# Patient Record
Sex: Male | Born: 1990 | Race: White | Hispanic: No | Marital: Single | Smoking: Former smoker
Health system: Southern US, Community
[De-identification: ages and names within clinical notes are randomized; demographics above are authoritative.]

## PROBLEM LIST (undated history)

## (undated) DIAGNOSIS — J15212 Pneumonia due to Methicillin resistant Staphylococcus aureus: Secondary | ICD-10-CM

## (undated) DIAGNOSIS — N200 Calculus of kidney: Secondary | ICD-10-CM

## (undated) HISTORY — PX: LUNG SURGERY: SHX703

---
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
## 2013-07-01 ENCOUNTER — Emergency Department (HOSPITAL_COMMUNITY)
Admission: EM | Admit: 2013-07-01 | Discharge: 2013-07-01 | Disposition: A | Discharge status: Home / Self Care | Payer: Self-pay | Attending: Emergency Medicine | Admitting: Emergency Medicine

## 2013-07-01 ENCOUNTER — Emergency Department (HOSPITAL_COMMUNITY): Payer: Self-pay

## 2013-07-01 ENCOUNTER — Encounter (HOSPITAL_COMMUNITY): Payer: Self-pay | Admitting: Emergency Medicine

## 2013-07-01 DIAGNOSIS — IMO0002 Reserved for concepts with insufficient information to code with codable children: Secondary | ICD-10-CM | POA: Insufficient documentation

## 2013-07-01 DIAGNOSIS — Z87891 Personal history of nicotine dependence: Secondary | ICD-10-CM | POA: Insufficient documentation

## 2013-07-01 DIAGNOSIS — J029 Acute pharyngitis, unspecified: Secondary | ICD-10-CM | POA: Insufficient documentation

## 2013-07-01 DIAGNOSIS — J45901 Unspecified asthma with (acute) exacerbation: Secondary | ICD-10-CM | POA: Insufficient documentation

## 2013-07-01 DIAGNOSIS — Z79899 Other long term (current) drug therapy: Secondary | ICD-10-CM | POA: Insufficient documentation

## 2013-07-01 DIAGNOSIS — R61 Generalized hyperhidrosis: Secondary | ICD-10-CM | POA: Insufficient documentation

## 2013-07-01 DIAGNOSIS — R112 Nausea with vomiting, unspecified: Secondary | ICD-10-CM | POA: Insufficient documentation

## 2013-07-01 MED ORDER — ALBUTEROL SULFATE (2.5 MG/3ML) 0.083% IN NEBU: 5.0000 mg | INHALATION_SOLUTION | RESPIRATORY_TRACT | Status: AC | PRN

## 2013-07-01 MED ORDER — ALBUTEROL SULFATE (2.5 MG/3ML) 0.083% IN NEBU
5.0000 mg | INHALATION_SOLUTION | Freq: Once | RESPIRATORY_TRACT | Status: AC
  Administered 2013-07-01: 5 mg via RESPIRATORY_TRACT
  Filled 2013-07-01: qty 6

## 2013-07-01 MED ORDER — PREDNISONE 20 MG PO TABS: 40.0000 mg | ORAL_TABLET | Freq: Every day | ORAL | Status: AC

## 2013-07-01 MED ORDER — ALBUTEROL SULFATE HFA 108 (90 BASE) MCG/ACT IN AERS: 2.0000 | INHALATION_SPRAY | RESPIRATORY_TRACT | Status: DC | PRN

## 2013-07-01 NOTE — ED Provider Notes (Signed)
CSN: [id number]     Arrival date & time 07/01/13  1319 History   First MD Initiated Contact with Patient 07/01/13 1332    This chart was scribed for Ricardo PiccoloJennifer Ceylon Arenson PA-C, a non-physician practitioner working with Leonette Mostharles B. Bernette MayersSheldon, MD by Lewanda RifeAlexandra Hurtado, ED Scribe. This patient was seen in room TR09C/TR09C and the patient's care was started at 1:45 PM     Chief Complaint  Patient presents with  . Shortness of Breath  . Cough   (Consider location/radiation/quality/duration/timing/severity/associated sxs/prior Treatment) The history is provided by the patient. No language interpreter was used.   HPI Comments: Ricardo SillJames Mccarty is a 23 y.o. male who presents to the Emergency Department complaining of intermittent worsening shortness of breath onset 3 days. Reports associated dry cough, sweats in the morning, and sore throat. Reports trying albuterol 2-3 times a day with moderate relief of symptoms. Reports symptoms are exacerbated at night and with exertion. Denies associated fever, nausea, and emesis. Denies recent long travel or extended immobility, prior history of DVT or PE, unilateral leg swelling or pain, hemoptysis, and active cancer. Denies hx of recent surgeries. Reports PMHx of asthma, and MRSA pneumonia 6 years ago. PERC negative.   Additionally, reports he has not been wearing a mask at work and is concerned he inhaled hazardous products.     Past Medical History  Diagnosis Date  . Asthma   . MRSA pneumonia    Past Surgical History  Procedure Laterality Date  . Lung surgery     No family history on file. History  Substance Use Topics  . Smoking status: Former Games developermoker  . Smokeless tobacco: Not on file  . Alcohol Use: No    Review of Systems  Constitutional: Positive for diaphoresis. Negative for fever.  HENT: Positive for sore throat.   Respiratory: Positive for cough and shortness of breath.   Cardiovascular: Negative for leg swelling.  Gastrointestinal:  Positive for nausea and vomiting.  Psychiatric/Behavioral: Negative for confusion.    Allergies  Review of patient's allergies indicates no known allergies.  Home Medications   Current Outpatient Rx  Name  Route  Sig  Dispense  Refill  . albuterol (PROVENTIL) (2.5 MG/3ML) 0.083% nebulizer solution   Nebulization   Take 2.5 mg by nebulization every 6 (six) hours as needed for wheezing or shortness of breath.         [name]. albuterol (PROVENTIL HFA;VENTOLIN HFA) 108 (90 BASE) MCG/ACT inhaler   Inhalation   Inhale 2 puffs into the lungs every 4 (four) hours as needed for wheezing or shortness of breath.   1 Inhaler   0   . albuterol (PROVENTIL) (2.5 MG/3ML) 0.083% nebulizer solution   Nebulization   Take 6 mLs (5 mg total) by nebulization every 4 (four) hours as needed for wheezing or shortness of breath.   75 mL   12   . predniSONE (DELTASONE) 20 MG tablet   Oral   Take 2 tablets (40 mg total) by mouth daily.   10 tablet   0    BP 141/96  Pulse 107  Temp(Src) 98.2 F (36.8 C) (Oral)  Resp 16  Ht 5\' 6"  (1.676 m)  Wt 160 lb (72.576 kg)  BMI 25.84 kg/m2  SpO2 98% Physical Exam  Constitutional: He is oriented to person, place, and time. He appears well-developed and well-nourished. No distress.  HENT:  Head: Normocephalic and atraumatic.  Right Ear: External ear normal.  Left Ear: External ear normal.  Nose: Nose normal.  Mouth/Throat: Uvula is midline, oropharynx is clear and moist and mucous membranes are normal. No trismus in the jaw. No oropharyngeal exudate, posterior oropharyngeal edema, posterior oropharyngeal erythema or tonsillar abscesses.  Eyes: Conjunctivae are normal.  Neck: Normal range of motion. Neck supple.  Cardiovascular: Normal rate and regular rhythm.   Pulmonary/Chest: Effort normal and breath sounds normal. No accessory muscle usage. Not tachypneic. No respiratory distress. He has no wheezes. He has no rales.  Mild expiratory wheeze   Abdominal:  Soft.  Musculoskeletal: Normal range of motion.  Neurological: He is alert and oriented to person, place, and time.  Skin: Skin is warm and dry. He is not diaphoretic.  Psychiatric: He has a normal mood and affect.    ED Course  Procedures  Medications  albuterol (PROVENTIL) (2.5 MG/3ML) 0.083% nebulizer solution 5 mg (5 mg Nebulization Given 07/01/13 1449)    COORDINATION OF CARE:  Nursing notes reviewed. Vital signs reviewed. Initial pt interview and examination performed.   1:42 PM-Discussed work up plan with pt at bedside, which includes CXR. Pt agrees with plan.  2:38 PM Nursing Notes Reviewed/ Care Coordinated Applicable Imaging Reviewed and incorporated into ED treatment Discussed results and treatment plan with pt. Pt demonstrates understanding and agrees with plan.  Treatment plan initiated: Medications  albuterol (PROVENTIL) (2.5 MG/3ML) 0.083% nebulizer solution 5 mg (5 mg Nebulization Given 07/01/13 1449)     Initial diagnostic testing ordered.    Labs Review Labs Reviewed - No data to display Imaging Review Dg Chest 2 View  07/01/2013   CLINICAL DATA:  Sore throat, cough  EXAM: CHEST  2 VIEW  COMPARISON:  01/01/2011  FINDINGS: The heart size and mediastinal contours are within normal limits. Both lungs are clear. The visualized skeletal structures are unremarkable.  IMPRESSION: No active cardiopulmonary disease.   Electronically Signed   By: [name] M.D.   On: 07/01/2013 14:33    EKG Interpretation   None       MDM   1. Asthma exacerbation     Afebrile, NAD, non-toxic appearing, AAOx4. Patient maintained O2 saturations maintained >90, no current signs of respiratory distress. Lung exam improved after nebulizer treatment. Prednisone burst will be given at d/c. Pt states they are breathing at baseline. CXR reviewed, no signs of infection. Pt has been instructed to continue using prescribed medications and to speak with PCP about today's exacerbation.  Patient is stable at time of discharge     I personally performed the services described in this documentation, which was scribed in my presence. The recorded information has been reviewed and is accurate.    Jeannetta Ellis, PA-C 07/01/13 1512

## 2013-07-01 NOTE — ED Notes (Signed)
Pt reports last 2-3 days when waking up in the mornings has had shortness of breath. States had MRSA in his lungs as a child. 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

## 2013-07-01 NOTE — ED Notes (Signed)
Kelly RT called for hour long neb treatment

## 2013-07-01 NOTE — Discharge Instructions (Signed)
Please follow up with your primary care physician in 1-2 days. If you do not have one please call the [facility] number listed above. Please take medications as prescribed. Please read all discharge instructions and return precautions.    Asthma, Adult Asthma is a recurring condition in which the airways tighten and narrow. Asthma can make it difficult to breathe. It can cause coughing, wheezing, and shortness of breath. Asthma episodes (also called asthma attacks) range from minor to life-threatening. Asthma cannot be cured, but medicines and lifestyle changes can help control it. CAUSES Asthma is believed to be caused by inherited (genetic) and environmental factors, but its exact cause is unknown. Asthma may be triggered by allergens, lung infections, or irritants in the air. Asthma triggers are different for each person. Common triggers include:   Animal dander.  Dust mites.  Cockroaches.  Pollen from trees or grass.  Mold.  Smoke.  Air pollutants such as dust, household cleaners, hair sprays, aerosol sprays, paint fumes, strong chemicals, or strong odors.  Cold air, weather changes, and winds (which increase molds and pollens in the air).  Strong emotional expressions such as crying or laughing hard.  Stress.  Certain medicines (such as aspirin) or types of drugs (such as beta-blockers).  Sulfites in foods and drinks. Foods and drinks that may contain sulfites include dried fruit, potato chips, and sparkling grape juice.  Infections or inflammatory conditions such as the flu, a cold, or an inflammation of the nasal membranes (rhinitis).  Gastroesophageal reflux disease (GERD).  Exercise or strenuous activity. SYMPTOMS Symptoms may occur immediately after asthma is triggered or many hours later. Symptoms include:  Wheezing.  Excessive nighttime or early morning coughing.  Frequent or severe coughing with a common cold.  Chest  tightness.  Shortness of breath. DIAGNOSIS  The diagnosis of asthma is made by a review of your medical history and a physical exam. Tests may also be performed. These may include:  Lung function studies. These tests show how much air you breath in and out.  Allergy tests.  Imaging tests such as X-rays. TREATMENT  Asthma cannot be cured, but it can usually be controlled. Treatment involves identifying and avoiding your asthma triggers. It also involves medicines. There are 2 classes of medicine used for asthma treatment:   Controller medicines. These prevent asthma symptoms from occurring. They are usually taken every day.  Reliever or rescue medicines. These quickly relieve asthma symptoms. They are used as needed and provide short-term relief. Your health care provider will help you create an asthma action plan. An asthma action plan is a written plan for managing and treating your asthma attacks. It includes a list of your asthma triggers and how they may be avoided. It also includes information on when medicines should be taken and when their dosage should be changed. An action plan may also involve the use of a device called a peak flow meter. A peak flow meter measures how well the lungs are working. It helps you monitor your condition. HOME CARE INSTRUCTIONS   Take medicine as directed by your health care provider. Speak with your health care provider if you have questions about how or when to take the medicines.  Use a peak flow meter as directed by your health care provider. Record and keep track of readings.  Understand and use the action plan to help minimize or stop an asthma attack without needing to seek medical care.  Control your home environment in the  following ways to help prevent asthma attacks:  Do not smoke. Avoid being exposed to secondhand smoke.  Change your heating and air conditioning filter regularly.  Limit your use of fireplaces and wood stoves.  Get  rid of pests (such as roaches and mice) and their droppings.  Throw away plants if you see mold on them.  Clean your floors and dust regularly. Use unscented cleaning products.  Try to have someone else vacuum for you regularly. Stay out of rooms while they are being vacuumed and for a short while afterward. If you vacuum, use a dust mask from a hardware store, a double-layered or microfilter vacuum cleaner bag, or a vacuum cleaner with a HEPA filter.  Replace carpet with wood, tile, or vinyl flooring. Carpet can trap dander and dust.  Use allergy-proof pillows, mattress covers, and box spring covers.  Wash bed sheets and blankets every week in hot water and dry them in a dryer.  Use blankets that are made of polyester or cotton.  Clean bathrooms and kitchens with bleach. If possible, have someone repaint the walls in these rooms with mold-resistant paint. Keep out of the rooms that are being cleaned and painted.  Wash hands frequently. SEEK MEDICAL CARE IF:   You have wheezing, shortness of breath, or a cough even if taking medicine to prevent attacks.  The colored mucus you cough up (sputum) is thicker than usual.  Your sputum changes from clear or white to yellow, green, gray, or bloody.  You have any problems that may be related to the medicines you are taking (such as a rash, itching, swelling, or trouble breathing).  You are using a reliever medicine more than 2 3 times per week.  Your peak flow is still at 50 79% of you personal best after following your action plan for 1 hour. SEEK IMMEDIATE MEDICAL CARE IF:   You seem to be getting worse and are unresponsive to treatment during an asthma attack.  You are short of breath even at rest.  You get short of breath when doing very little physical activity.  You have difficulty eating, drinking, or talking due to asthma symptoms.  You develop chest pain.  You develop a fast heartbeat.  You have a bluish color to your  lips or fingernails.  You are lightheaded, dizzy, or faint.  Your peak flow is less than 50% of your personal best.  You have a fever or persistent symptoms for more than 2 3 days.  You have a fever and symptoms suddenly get worse. MAKE SURE YOU:   Understand these instructions.  Will watch your condition.  Will get help right away if you are not doing well or get worse. Document Released: 05/20/2005 Document Revised: 01/20/2013 Document Reviewed: 12/17/2012 [facility] Patient Information 2014 [place], [place].

## 2013-07-01 NOTE — ED Notes (Signed)
Patient transported to X-ray 

## 2013-07-02 NOTE — ED Provider Notes (Signed)
Medical screening examination/treatment/procedure(s) were performed by non-physician practitioner and as supervising physician I was immediately available for consultation/collaboration.  EKG Interpretation   None         Messiah Rovira B. Sheetal Lyall, MD 07/02/13 0935 

## 2014-08-30 ENCOUNTER — Emergency Department (HOSPITAL_COMMUNITY)
Admission: EM | Admit: 2014-08-30 | Discharge: 2014-08-31 | Disposition: A | Discharge status: Home / Self Care | Payer: Medicaid Other | Attending: Emergency Medicine | Admitting: Emergency Medicine

## 2014-08-30 ENCOUNTER — Encounter (HOSPITAL_COMMUNITY): Payer: Self-pay | Admitting: *Deleted

## 2014-08-30 DIAGNOSIS — J45909 Unspecified asthma, uncomplicated: Secondary | ICD-10-CM | POA: Insufficient documentation

## 2014-08-30 DIAGNOSIS — Y9289 Other specified places as the place of occurrence of the external cause: Secondary | ICD-10-CM | POA: Insufficient documentation

## 2014-08-30 DIAGNOSIS — Y9389 Activity, other specified: Secondary | ICD-10-CM | POA: Insufficient documentation

## 2014-08-30 DIAGNOSIS — W260XXA Contact with knife, initial encounter: Secondary | ICD-10-CM | POA: Insufficient documentation

## 2014-08-30 DIAGNOSIS — Y998 Other external cause status: Secondary | ICD-10-CM | POA: Insufficient documentation

## 2014-08-30 DIAGNOSIS — Z79899 Other long term (current) drug therapy: Secondary | ICD-10-CM | POA: Insufficient documentation

## 2014-08-30 DIAGNOSIS — Z87891 Personal history of nicotine dependence: Secondary | ICD-10-CM | POA: Insufficient documentation

## 2014-08-30 DIAGNOSIS — Z8701 Personal history of pneumonia (recurrent): Secondary | ICD-10-CM | POA: Insufficient documentation

## 2014-08-30 DIAGNOSIS — Z8614 Personal history of Methicillin resistant Staphylococcus aureus infection: Secondary | ICD-10-CM | POA: Insufficient documentation

## 2014-08-30 DIAGNOSIS — Z7952 Long term (current) use of systemic steroids: Secondary | ICD-10-CM | POA: Insufficient documentation

## 2014-08-30 DIAGNOSIS — Z23 Encounter for immunization: Secondary | ICD-10-CM | POA: Insufficient documentation

## 2014-08-30 DIAGNOSIS — IMO0002 Reserved for concepts with insufficient information to code with codable children: Secondary | ICD-10-CM

## 2014-08-30 DIAGNOSIS — S61211A Laceration without foreign body of left index finger without damage to nail, initial encounter: Secondary | ICD-10-CM | POA: Insufficient documentation

## 2014-08-30 MED ORDER — TETANUS-DIPHTH-ACELL PERTUSSIS 5-2.5-18.5 LF-MCG/0.5 IM SUSP
0.5000 mL | Freq: Once | INTRAMUSCULAR | Status: AC
  Administered 2014-08-31: 0.5 mL via INTRAMUSCULAR
  Filled 2014-08-30: qty 0.5

## 2014-08-30 MED ORDER — LIDOCAINE HCL (PF) 1 % IJ SOLN
5.0000 mL | Freq: Once | INTRAMUSCULAR | Status: AC
Start: 2014-08-30 — End: 2014-08-31
  Administered 2014-08-31: 5 mL via INTRADERMAL
  Filled 2014-08-30: qty 5

## 2014-08-30 NOTE — ED Notes (Signed)
The pt has a laceration to the lt hand at the base of his lt index finger.  He was cutting pvc pipe and his jknife slipped and cut his hand.  No active bleeding.  Superficial appearing cut

## 2014-08-30 NOTE — ED Provider Notes (Signed)
CSN: [id number]     Arrival date & time 08/30/14  2216 History  This chart was scribed for Harle BattiestElizabeth Gray Maugeri, NP, working with Derwood KaplanAnkit Nanavati, MD by Elon SpannerGarrett Cook, ED Scribe. This patient was seen in room TR08C/TR08C and the patient's care was started at 11:35 PM.   Chief Complaint  Patient presents with  . Laceration   The history is provided by the patient. No language interpreter was used.   HPI Comments: Ricardo Mccarty is a 24 y.o. male who presents to the Emergency Department complaining of a laceration on the dorsal aspect of the left hand sustained earlier 1.5 hours PTA.  The patient reports he was trying to cut PVC pipe with a clean, straight-edge pocket knife when it slipped and caused the laceration. He reports 2-3/10 pain, worse with movement.  Patient is unsure if he is UTD on tetanus.   Past Medical History  Diagnosis Date  . Asthma   . MRSA pneumonia    Past Surgical History  Procedure Laterality Date  . Lung surgery     No family history on file. History  Substance Use Topics  . Smoking status: Former Games developermoker  . Smokeless tobacco: Not on file  . Alcohol Use: No    Review of Systems  Skin: Positive for wound.  Neurological: Negative for weakness.      Allergies  Review of patient's allergies indicates no known allergies.  Home Medications   Prior to Admission medications   Medication Sig Start Date End Date Taking? Authorizing Provider  albuterol (PROVENTIL HFA;VENTOLIN HFA) 108 (90 BASE) MCG/ACT inhaler Inhale 2 puffs into the lungs every 4 (four) hours as needed for wheezing or shortness of breath. 07/01/13   Francee PiccoloJennifer Piepenbrink, PA-C  albuterol (PROVENTIL) (2.5 MG/3ML) 0.083% nebulizer solution Take 2.5 mg by nebulization every 6 (six) hours as needed for wheezing or shortness of breath.    Historical Provider, MD  albuterol (PROVENTIL) (2.5 MG/3ML) 0.083% nebulizer solution Take 6 mLs (5 mg total) by nebulization every 4 (four) hours as needed for  wheezing or shortness of breath. 07/01/13   Francee PiccoloJennifer Piepenbrink, PA-C  predniSONE (DELTASONE) 20 MG tablet Take 2 tablets (40 mg total) by mouth daily. 07/01/13   Jennifer Piepenbrink, PA-C   BP 142/80 mmHg  Pulse 81  Temp(Src) 97.8 F (36.6 C)  Resp 18  SpO2 97% Physical Exam  Constitutional: He is oriented to person, place, and time. He appears well-developed and well-nourished. No distress.  HENT:  Head: Normocephalic and atraumatic.  Eyes: Conjunctivae and EOM are normal.  Neck: Neck supple. No tracheal deviation present.  Cardiovascular: Normal rate.   Pulmonary/Chest: Effort normal. No respiratory distress.  Musculoskeletal: Normal range of motion.  Neurological: He is alert and oriented to person, place, and time.  5/5 with flexion and extension for his index finger.  NVI  Skin: Skin is warm and dry.  2 cm linear laceration over the MCP of the index finger on the dorsal aspect of the left hand   Psychiatric: He has a normal mood and affect. His behavior is normal.  Nursing note and vitals reviewed.   ED Course  Procedures (including critical care time)  LACERATION REPAIR Performed by: Harle Battiestysinger, Zeniah Briney Authorized by: Harle Battiestysinger, Adyan Palau Consent: Verbal consent obtained. Risks and benefits: risks, benefits and alternatives were discussed Consent given by: patient Patient identity confirmed: provided demographic data Prepped and Draped in normal sterile fashion Wound explored  Laceration Location: MCP of the index finger on the dorsal aspect of the  left hand    Laceration Length: 2 cm  No Foreign Bodies seen or palpated  Anesthesia: local infiltration  Local anesthetic: lidocaine 1% without epinephrine  Anesthetic total: 3 ml  Irrigation method: syringe Amount of cleaning: standard  Skin closure: 5-0 prolene  Number of sutures: 3  Technique: simple interrupted  Patient tolerance: Patient tolerated the procedure well with no immediate  complications.   DIAGNOSTIC STUDIES: Oxygen Saturation is 97% on RA, normal by my interpretation.    COORDINATION OF CARE:  11:38 PM Discussed treatment plan with patient at bedside.  Patient acknowledges and agrees with plan.    Labs Review Labs Reviewed - No data to display  Imaging Review No results found.   EKG Interpretation None      MDM   Final diagnoses:  Laceration   24 yo with laceration. Pressure irrigation performed and lac repair occurred less < 2 hours after injury and was well tolerated. Tdap booster given.  Pt has no co morbidities to effect normal wound healing. Discussed suture home care w pt and answered questions. Pt to return for follow-up for wound check and suture removal in 7 days. Pt is well-appearing, in no acute distress and vital signs reviewed and not concerning. He appears safe to be discharged.  Return precautions provided. Pt aware of plan and in agreements.     I personally performed the services described in this documentation, which was scribed in my presence. The recorded information has been reviewed and is accurate.  Filed Vitals:   08/30/14 2222 08/31/14 0119  BP: 142/80 126/83  Pulse: 81 92  Temp: 97.8 F (36.6 C) 98.3 F (36.8 C)  TempSrc:  Oral  Resp: 18 22  SpO2: 97% 99%   Meds given in ED:  Medications  Tdap (BOOSTRIX) injection 0.5 mL (0.5 mLs Intramuscular Given 08/31/14 0008)  lidocaine (PF) (XYLOCAINE) 1 % injection 5 mL (5 mLs Intradermal Given by Other 08/31/14 0007)    Discharge Medication List as of 08/31/2014  1:19 AM        Harle Battiest, NP 09/01/14 1143  Doug Sou, MD 09/01/14 1456

## 2014-08-31 NOTE — ED Notes (Signed)
Pt c/o laceration to knuckle of L lower index finger. Bleeding controlled on assessment. Pt reports cutting a pvc pipe when the knife slipped and cut his hand. ~3cm lac noted

## 2014-08-31 NOTE — Discharge Instructions (Signed)
Please follow the directions provided. Be sure to return in 7 days for wound recheck and suture removal. Please keep your wound clean and dry and change your dressing daily. Don't hesitate to return for any new, worsening or concerning symptoms.    SEEK IMMEDIATE MEDICAL CARE IF:  Your pain is not controlled with prescribed medicine.  You have severe swelling around the wound causing pain and numbness or a change in color in your arm, hand, leg, or foot.  Your wound splits open and starts bleeding.  You have worsening numbness, weakness, or loss of function of any joint around or beyond the wound.  You develop painful lumps near the wound or on the skin anywhere on your body.

## 2014-09-06 ENCOUNTER — Encounter (HOSPITAL_COMMUNITY): Payer: Self-pay | Admitting: Emergency Medicine

## 2014-09-06 ENCOUNTER — Emergency Department (INDEPENDENT_AMBULATORY_CARE_PROVIDER_SITE_OTHER)
Admission: EM | Admit: 2014-09-06 | Discharge: 2014-09-06 | Disposition: A | Discharge status: Home / Self Care | Payer: Self-pay | Source: Home / Self Care | Attending: Family Medicine | Admitting: Family Medicine

## 2014-09-06 DIAGNOSIS — Z4802 Encounter for removal of sutures: Secondary | ICD-10-CM

## 2014-09-06 NOTE — ED Provider Notes (Signed)
CSN: [id number]     Arrival date & time 09/06/14  0912 History   First MD Initiated Contact with Patient 09/06/14 0932     Chief Complaint  Patient presents with  . Suture / Staple Removal   (Consider location/radiation/quality/duration/timing/severity/associated sxs/prior Treatment) HPI Comments: Patient has laceration repair on left hand at dorsal base of left index finger on 08/30/2014. Is here for suture removal. Denies any issues with wound since repair.   Patient is a 24 y.o. male presenting with suture removal. The history is provided by the patient.  Suture / Staple Removal    Past Medical History  Diagnosis Date  . Asthma   . MRSA pneumonia    Past Surgical History  Procedure Laterality Date  . Lung surgery     No family history on file. History  Substance Use Topics  . Smoking status: Former Games developermoker  . Smokeless tobacco: Not on file  . Alcohol Use: No    Review of Systems  All other systems reviewed and are negative.   Allergies  Review of patient's allergies indicates no known allergies.  Home Medications   Prior to Admission medications   Medication Sig Start Date End Date Taking? Authorizing Provider  albuterol (PROVENTIL HFA;VENTOLIN HFA) 108 (90 BASE) MCG/ACT inhaler Inhale 2 puffs into the lungs every 4 (four) hours as needed for wheezing or shortness of breath. 07/01/13   Francee PiccoloJennifer Piepenbrink, PA-C  albuterol (PROVENTIL) (2.5 MG/3ML) 0.083% nebulizer solution Take 2.5 mg by nebulization every 6 (six) hours as needed for wheezing or shortness of breath.    Historical Provider, MD  albuterol (PROVENTIL) (2.5 MG/3ML) 0.083% nebulizer solution Take 6 mLs (5 mg total) by nebulization every 4 (four) hours as needed for wheezing or shortness of breath. 07/01/13   Francee PiccoloJennifer Piepenbrink, PA-C  predniSONE (DELTASONE) 20 MG tablet Take 2 tablets (40 mg total) by mouth daily. 07/01/13   Shriley Joffe Piepenbrink, PA-C   BP 131/91 mmHg  Pulse 83  Temp(Src) 97.9 F (36.6 C)  (Oral)  Resp 16  SpO2 100% Physical Exam  Constitutional: He is oriented to person, place, and time. He appears well-developed and well-nourished. No distress.  HENT:  Head: Normocephalic.  Cardiovascular: Normal rate.   Pulmonary/Chest: Effort normal.  Musculoskeletal: Normal range of motion.  Neurological: He is alert and oriented to person, place, and time.  Skin: Skin is warm and dry.     Linear 1.5 cm healing laceration with 2 visible prolene sutures (ER report from 3/29 reports 3 sutures placed, however, patient reports provider only placed 2 sutures at time of repair) No clinical signs of infection   Psychiatric: He has a normal mood and affect. His behavior is normal.  Nursing note and vitals reviewed.   ED Course  Procedures (including critical care time) Labs Review Labs Reviewed - No data to display  Imaging Review No results found.   MDM   1. Encounter for removal of sutures   sutures x 2 removed without incident steristrips applied Follow up prn Wound care at home  Ria ClockJennifer Lee H Floye Fesler, PA 09/06/14 1016

## 2014-09-06 NOTE — ED Notes (Signed)
Seen in ed 3/29.  Seen for laceration to left index finger.  Patient here today for suture removal

## 2014-09-06 NOTE — Discharge Instructions (Signed)

## 2020-08-16 ENCOUNTER — Ambulatory Visit
Admission: RE | Admit: 2020-08-16 | Discharge: 2020-08-16 | Disposition: A | Discharge status: Home / Self Care | Payer: Self-pay | Source: Ambulatory Visit | Attending: Family Medicine | Admitting: Family Medicine

## 2020-08-16 ENCOUNTER — Other Ambulatory Visit: Payer: Self-pay

## 2020-08-16 VITALS — BP 132/89 | HR 97 | Temp 97.8°F

## 2020-08-16 DIAGNOSIS — J069 Acute upper respiratory infection, unspecified: Secondary | ICD-10-CM

## 2020-08-16 DIAGNOSIS — Z1152 Encounter for screening for COVID-19: Secondary | ICD-10-CM

## 2020-08-16 MED ORDER — PROMETHAZINE-DM 6.25-15 MG/5ML PO SYRP: 5.0000 mL | ORAL_SOLUTION | Freq: Four times a day (QID) | ORAL | 0 refills | Status: DC | PRN

## 2020-08-16 MED ORDER — ALBUTEROL SULFATE HFA 108 (90 BASE) MCG/ACT IN AERS: 2.0000 | INHALATION_SPRAY | Freq: Four times a day (QID) | RESPIRATORY_TRACT | 0 refills | Status: AC | PRN

## 2020-08-16 NOTE — ED Triage Notes (Signed)
Pt presents with c/o cough and sore throat that began yesterday, needs covid test for work

## 2020-08-17 LAB — NOVEL CORONAVIRUS, NAA: SARS-CoV-2, NAA: NOT DETECTED

## 2020-08-17 LAB — SARS-COV-2, NAA 2 DAY TAT

## 2020-08-20 NOTE — ED Provider Notes (Signed)
[facility]    CSN: [id number] Arrival date & time: 08/16/20  1245      History   Chief Complaint Chief Complaint  Patient presents with  . Sore Throat  . Cough    HPI Ricardo Mccarty is a 30 y.o. male.   Presenting today with 1 day history of cough, sore throat, fatigue. Denies fever, chills, CP, SOB, N/V/D, abdominal pain, body aches. So far taking OTC remedies with minimal relief. Daughter and wife sick with similar sxs. Requesting COVID test for work. Hx of asthma, former smoker - on albuterol inhaler and neb soln as needed.      Past Medical History:  Diagnosis Date  . Asthma   . MRSA pneumonia (HCC)     There are no problems to display for this patient.   Past Surgical History:  Procedure Laterality Date  . LUNG SURGERY         Home Medications    Prior to Admission medications   Medication Sig Start Date End Date Taking? Authorizing Provider  promethazine-dextromethorphan (PROMETHAZINE-DM) 6.25-15 MG/5ML syrup Take 5 mLs by mouth 4 (four) times daily as needed for cough. 08/16/20  Yes Particia Nearing, PA-C  albuterol (PROVENTIL) (2.5 MG/3ML) 0.083% nebulizer solution Take 2.5 mg by nebulization every 6 (six) hours as needed for wheezing or shortness of breath.    [provider]  albuterol (PROVENTIL) (2.5 MG/3ML) 0.083% nebulizer solution Take 6 mLs (5 mg total) by nebulization every 4 (four) hours as needed for wheezing or shortness of breath. 07/01/13   Piepenbrink, Victorino Dike, PA-C  albuterol (VENTOLIN HFA) 108 (90 Base) MCG/ACT inhaler Inhale 2 puffs into the lungs every 6 (six) hours as needed for wheezing or shortness of breath. 08/16/20   Particia Nearing, PA-C  predniSONE (DELTASONE) 20 MG tablet Take 2 tablets (40 mg total) by mouth daily. 07/01/13   Piepenbrink, Victorino Dike, PA-C    Family History Family History  Family history unknown: Yes    Social History Social History   Tobacco Use  . Smoking status: Former  Smoker  Substance Use Topics  . Alcohol use: No  . Drug use: No     Allergies   Patient has no known allergies.   Review of Systems Review of Systems PER HPI    Physical Exam Triage Vital Signs ED Triage Vitals  Enc Vitals Group     BP 08/16/20 1259 132/89     Pulse Rate 08/16/20 1259 97     Resp --      Temp 08/16/20 1259 97.8 F (36.6 C)     Temp src --      SpO2 08/16/20 1259 97 %     Weight --      Height --      Head Circumference --      Peak Flow --      Pain Score 08/16/20 1257 6     Pain Loc --      Pain Edu? --      Excl. in GC? --    No data found.  Updated Vital Signs BP 132/89   Pulse 97   Temp 97.8 F (36.6 C)   SpO2 97%   Visual Acuity Right Eye Distance:   Left Eye Distance:   Bilateral Distance:    Right Eye Near:   Left Eye Near:    Bilateral Near:     Physical Exam Vitals and nursing note reviewed.  Constitutional:      General:  He is not in acute distress.    Appearance: Normal appearance. He is not toxic-appearing.  HENT:     Head: Atraumatic.     Right Ear: Tympanic membrane normal.     Left Ear: Tympanic membrane normal.     Nose: Rhinorrhea present.     Mouth/Throat:     Mouth: Mucous membranes are moist.     Pharynx: Posterior oropharyngeal erythema present. No oropharyngeal exudate.  Eyes:     Extraocular Movements: Extraocular movements intact.     Conjunctiva/sclera: Conjunctivae normal.  Cardiovascular:     Rate and Rhythm: Normal rate and regular rhythm.     Heart sounds: Normal heart sounds.  Pulmonary:     Effort: Pulmonary effort is normal. No respiratory distress.     Breath sounds: Normal breath sounds. No wheezing or rales.  Abdominal:     General: Bowel sounds are normal. There is no distension.     Palpations: Abdomen is soft.     Tenderness: There is no abdominal tenderness. There is no guarding.  Musculoskeletal:        General: Normal range of motion.     Cervical back: Normal range of motion  and neck supple.  Skin:    General: Skin is warm and dry.  Neurological:     General: No focal deficit present.     Mental Status: He is alert and oriented to person, place, and time.  Psychiatric:        Mood and Affect: Mood normal.        Thought Content: Thought content normal.        Judgment: Judgment normal.      UC Treatments / Results  Labs (all labs ordered are listed, but only abnormal results are displayed) Labs Reviewed  NOVEL CORONAVIRUS, NAA   Narrative:    Performed at:  [address] [place], [place]  [id number] Lab Director: Jolene Schimke MD, Phone:  [phone]  SARS-COV-2, NAA 2 DAY TAT   Narrative:    Performed at:  [address] [place] [address] [place], [place]  [id number] Lab Director: Jolene Schimke MD, Phone:  [phone]    EKG   Radiology No results found.  Procedures Procedures (including critical care time)  Medications Ordered in UC Medications - No data to display  Initial Impression / Assessment and Plan / UC Course  I have reviewed the triage vital signs and the nursing notes.  Pertinent labs & imaging results that were available during my care of the patient were reviewed by me and considered in my medical decision making (see chart for details).     Exam, vitals reassuring today. COVID pcr pending, discussed albuterol, phenergan DM, and OTC supportive medications and home care. Return for acutely worsening sxs. Work note given.   Final Clinical Impressions(s) / UC Diagnoses   Final diagnoses:  Viral URI with cough   Discharge Instructions   None    ED Prescriptions    Medication Sig Dispense Auth. Provider   albuterol (VENTOLIN HFA) 108 (90 Base) MCG/ACT inhaler Inhale 2 puffs into the lungs every 6 (six) hours as needed for wheezing or shortness of breath. 1 each Particia Nearing, PA-C   promethazine-dextromethorphan (PROMETHAZINE-DM) 6.25-15 MG/5ML syrup Take 5 mLs by mouth 4  (four) times daily as needed for cough. 118 mL Particia Nearing, [place]     PDMP not reviewed this encounter.   Roosvelt Maser [place], [place] 08/20/20 [phone]

## 2020-10-07 ENCOUNTER — Ambulatory Visit
Admission: EM | Admit: 2020-10-07 | Discharge: 2020-10-07 | Disposition: A | Discharge status: Home / Self Care | Payer: Self-pay | Attending: Family Medicine | Admitting: Family Medicine

## 2020-10-07 ENCOUNTER — Encounter: Payer: Self-pay | Admitting: *Deleted

## 2020-10-07 ENCOUNTER — Other Ambulatory Visit: Payer: Self-pay

## 2020-10-07 DIAGNOSIS — J069 Acute upper respiratory infection, unspecified: Secondary | ICD-10-CM

## 2020-10-07 DIAGNOSIS — Z20822 Contact with and (suspected) exposure to covid-19: Secondary | ICD-10-CM

## 2020-10-07 NOTE — Discharge Instructions (Signed)
You have been tested for COVID-19 today. °If your test returns positive, you will receive a phone call from [facility] regarding your results. °Negative test results are not called. °Both positive and negative results area always visible on MyChart. °If you do not have a MyChart account, sign up instructions are provided in your discharge papers. °Please do not hesitate to contact us should you have questions or concerns. ° °

## 2020-10-07 NOTE — ED Triage Notes (Signed)
C/O starting with sneezing onset yesterday which progressed into chills, tactile fever, cough, sore throat.

## 2020-10-08 ENCOUNTER — Ambulatory Visit: Payer: Self-pay

## 2020-10-09 LAB — NOVEL CORONAVIRUS, NAA: SARS-CoV-2, NAA: NOT DETECTED

## 2020-10-09 LAB — SARS-COV-2, NAA 2 DAY TAT

## 2020-10-09 NOTE — ED Provider Notes (Signed)
  [facility]   [id number] 10/07/20 Arrival Time: 1253  ASSESSMENT & PLAN:  1. Encounter for laboratory testing for COVID-19 virus   2. Viral URI with cough    COVID-19 testing sent. See letter/work note on file for self-isolation guidelines. OTC symptom care as needed.    Follow-up Information    [facility] .   Specialty: Urgent Care Why: If worsening or failing to improve as anticipated. Contact information: [address] [id number] mc [place] [address] [phone]              Reviewed expectations re: course of current medical issues. Questions answered. Outlined signs and symptoms indicating need for more acute intervention. Understanding verbalized. After Visit Summary given.   SUBJECTIVE: History from: patient. Ricardo Mccarty is a 30 y.o. male who presents with worries regarding COVID-19. Known COVID-19 contact: ques co-worker. Recent travel: none. Reports: chills, subj fever, cough, mild ST. Denies: difficulty breathing. Normal PO intake without n/v/d.    OBJECTIVE:  Vitals:   10/07/20 1306  BP: (!) 132/91  Pulse: 93  Resp: 16  Temp: 98 F (36.7 C)  TempSrc: Oral  SpO2: 97%    General appearance: alert; no distress Eyes: PERRLA; EOMI; conjunctiva normal HENT: [place]; AT; with mild nasal congestion Neck: supple  Lungs: speaks full sentences without difficulty; unlabored Extremities: no edema Skin: warm and dry Neurologic: normal gait Psychological: alert and cooperative; normal mood and affect  Labs: Results for orders placed or performed during the hospital encounter of 10/07/20  Novel Coronavirus, NAA ([facility])   Specimen: Nasopharyngeal Swab; Nasopharyngeal(NP) swabs in vial transport medium   Nasopharynge  Result Value Ref Range   SARS-CoV-2, NAA Not Detected Not Detected  SARS-COV-2, NAA 2 DAY TAT   Nasopharynge  Result Value Ref Range   SARS-CoV-2, NAA 2 DAY TAT  Performed       No Known Allergies  Past Medical History:  Diagnosis Date  . Asthma   . MRSA pneumonia [facility])    Social History   Socioeconomic History  . Marital status: Single    Spouse name: Not on file  . Number of children: Not on file  . Years of education: Not on file  . Highest education level: Not on file  Occupational History  . Not on file  Tobacco Use  . Smoking status: Former Games developer  . Smokeless tobacco: Never Used  Vaping Use  . Vaping Use: Never used  Substance and Sexual Activity  . Alcohol use: No  . Drug use: No  . Sexual activity: Not on file  Other Topics Concern  . Not on file  Social History Narrative  . Not on file   Social Determinants of Health   Financial Resource Strain: Not on file  Food Insecurity: Not on file  Transportation Needs: Not on file  Physical Activity: Not on file  Stress: Not on file  Social Connections: Not on file  Intimate Partner Violence: Not on file   Family History  Problem Relation Age of Onset  . Hypertension Mother   . Hypertension Father    Past Surgical History:  Procedure Laterality Date  . LUNG SURGERY       Mardella Layman, MD 10/09/20 [phone]

## 2021-11-07 ENCOUNTER — Ambulatory Visit
Admission: RE | Admit: 2021-11-07 | Discharge: 2021-11-07 | Disposition: A | Discharge status: Home / Self Care | Payer: Self-pay | Source: Ambulatory Visit | Attending: Internal Medicine | Admitting: Internal Medicine

## 2021-11-07 VITALS — BP 137/98 | HR 102 | Temp 98.2°F | Resp 18

## 2021-11-07 DIAGNOSIS — A084 Viral intestinal infection, unspecified: Secondary | ICD-10-CM

## 2021-11-07 DIAGNOSIS — R197 Diarrhea, unspecified: Secondary | ICD-10-CM

## 2021-11-07 DIAGNOSIS — R112 Nausea with vomiting, unspecified: Secondary | ICD-10-CM

## 2021-11-07 MED ORDER — ONDANSETRON 4 MG PO TBDP: 4.0000 mg | ORAL_TABLET | Freq: Three times a day (TID) | ORAL | 0 refills | Status: DC | PRN

## 2021-11-07 NOTE — ED Triage Notes (Signed)
Pt presents with generalized abdominal pain, vomiting, and diarrhea since yesterday.  

## 2021-11-07 NOTE — ED Provider Notes (Signed)
[name]    CSN: [id number] Arrival date & time: 11/07/21  1637      History   Chief Complaint Chief Complaint  Patient presents with   Emesis   Abdominal Pain   Diarrhea    HPI Ricardo Mccarty is a 31 y.o. male.   Patient presents with nausea, vomiting, diarrhea that started yesterday.  He reports that symptoms have improved and he has not had any vomiting today.  Denies blood in stool or emesis.  Denies any known sick contacts.  Denies any upper respiratory symptoms, cough, fever, abdominal pain.  Patient has been to keep fluids down today but no foods.   Emesis Abdominal Pain Diarrhea  Past Medical History:  Diagnosis Date   Asthma    MRSA pneumonia (HCC)     There are no problems to display for this patient.   Past Surgical History:  Procedure Laterality Date   LUNG SURGERY         Home Medications    Prior to Admission medications   Medication Sig Start Date End Date Taking? Authorizing Provider  ondansetron (ZOFRAN-ODT) 4 MG disintegrating tablet Take 1 tablet (4 mg total) by mouth every 8 (eight) hours as needed for nausea or vomiting. 11/07/21  Yes Lavone Weisel, Rolly Salter E, FNP  albuterol (PROVENTIL) (2.5 MG/3ML) 0.083% nebulizer solution Take 2.5 mg by nebulization every 6 (six) hours as needed for wheezing or shortness of breath.    [provider]  albuterol (PROVENTIL) (2.5 MG/3ML) 0.083% nebulizer solution Take 6 mLs (5 mg total) by nebulization every 4 (four) hours as needed for wheezing or shortness of breath. 07/01/13   Piepenbrink, Victorino Dike, PA-C  albuterol (VENTOLIN HFA) 108 (90 Base) MCG/ACT inhaler Inhale 2 puffs into the lungs every 6 (six) hours as needed for wheezing or shortness of breath. 08/16/20   Particia Nearing, PA-C  predniSONE (DELTASONE) 20 MG tablet Take 2 tablets (40 mg total) by mouth daily. 07/01/13   Piepenbrink, Victorino Dike, PA-C  promethazine-dextromethorphan (PROMETHAZINE-DM) 6.25-15 MG/5ML syrup Take 5 mLs by  mouth 4 (four) times daily as needed for cough. 08/16/20   Particia Nearing, PA-C    Family History Family History  Problem Relation Age of Onset   Hypertension Mother    Hypertension Father     Social History Social History   Tobacco Use   Smoking status: Former   Smokeless tobacco: Never  Building services engineer Use: Never used  Substance Use Topics   Alcohol use: No   Drug use: No     Allergies   Patient has no known allergies.   Review of Systems Review of Systems Per HPI  Physical Exam Triage Vital Signs ED Triage Vitals  Enc Vitals Group     BP 11/07/21 1719 (!) 137/98     Pulse Rate 11/07/21 1719 (!) 102     Resp 11/07/21 1719 18     Temp 11/07/21 1719 98.2 F (36.8 C)     Temp Source 11/07/21 1719 Oral     SpO2 11/07/21 1719 95 %     Weight --      Height --      Head Circumference --      Peak Flow --      Pain Score 11/07/21 1718 4     Pain Loc --      Pain Edu? --      Excl. in GC? --    No data found.  Updated Vital Signs BP [name])  137/98 (BP Location: Right Arm)   Pulse (!) 102   Temp 98.2 F (36.8 C) (Oral)   Resp 18   SpO2 95%   Visual Acuity Right Eye Distance:   Left Eye Distance:   Bilateral Distance:    Right Eye Near:   Left Eye Near:    Bilateral Near:     Physical Exam Constitutional:      General: He is not in acute distress.    Appearance: Normal appearance. He is not toxic-appearing or diaphoretic.  HENT:     Head: Normocephalic and atraumatic.     Mouth/Throat:     Mouth: Mucous membranes are moist.     Pharynx: No posterior oropharyngeal erythema.  Eyes:     Extraocular Movements: Extraocular movements intact.     Conjunctiva/sclera: Conjunctivae normal.  Cardiovascular:     Rate and Rhythm: Normal rate and regular rhythm.     Pulses: Normal pulses.     Heart sounds: Normal heart sounds.  Pulmonary:     Effort: Pulmonary effort is normal. No respiratory distress.     Breath sounds: Normal breath sounds.   Abdominal:     General: Bowel sounds are normal. There is no distension.     Palpations: Abdomen is soft.     Tenderness: There is no abdominal tenderness.  Neurological:     General: No focal deficit present.     Mental Status: He is alert and oriented to person, place, and time. Mental status is at baseline.  Psychiatric:        Mood and Affect: Mood normal.        Behavior: Behavior normal.        Thought Content: Thought content normal.        Judgment: Judgment normal.     UC Treatments / Results  Labs (all labs ordered are listed, but only abnormal results are displayed) Labs Reviewed - No data to display  EKG   Radiology No results found.  Procedures Procedures (including critical care time)  Medications Ordered in UC Medications - No data to display  Initial Impression / Assessment and Plan / UC Course  I have reviewed the triage vital signs and the nursing notes.  Pertinent labs & imaging results that were available during my care of the patient were reviewed by me and considered in my medical decision making (see chart for details).     Suspect viral cause to patient's symptoms.  Do not think that imaging or any further evaluation in the hospital is necessary at this time.  Will prescribe ondansetron to take as needed for nausea.  No signs of dehydration on exam.  Patient advised to encourage adequate fluid hydration.  Discussed return and ER precautions.  Patient verbalized understanding and was agreeable with plan. Final Clinical Impressions(s) / UC Diagnoses   Final diagnoses:  Viral gastroenteritis  Nausea vomiting and diarrhea     Discharge Instructions      It appears that you may have a viral stomach bug.  This should run its course and self resolve.  A nausea medication has been prescribed to take as needed.  Please ensure adequate fluid hydration.  Go to the hospital if symptoms persist or worsen.    ED Prescriptions     Medication Sig  Dispense Auth. Provider   ondansetron (ZOFRAN-ODT) 4 MG disintegrating tablet Take 1 tablet (4 mg total) by mouth every 8 (eight) hours as needed for nausea or vomiting. 20 tablet [place], [place] E,  FNP      PDMP not reviewed this encounter.   Gustavus BryantMound, Kayslee Furey E, [place] 11/07/21 1733

## 2021-11-07 NOTE — Discharge Instructions (Signed)
It appears that you may have a viral stomach bug.  This should run its course and self resolve.  A nausea medication has been prescribed to take as needed.  Please ensure adequate fluid hydration.  Go to the hospital if symptoms persist or worsen.

## 2022-03-07 ENCOUNTER — Ambulatory Visit
Admission: RE | Admit: 2022-03-07 | Discharge: 2022-03-07 | Disposition: A | Discharge status: Home / Self Care | Payer: Self-pay | Source: Ambulatory Visit | Attending: Physician Assistant | Admitting: Physician Assistant

## 2022-03-07 ENCOUNTER — Other Ambulatory Visit: Payer: Self-pay

## 2022-03-07 VITALS — BP 158/104 | HR 104 | Temp 98.2°F | Resp 18

## 2022-03-07 DIAGNOSIS — K529 Noninfective gastroenteritis and colitis, unspecified: Secondary | ICD-10-CM

## 2022-03-07 MED ORDER — ONDANSETRON 4 MG PO TBDP: 4.0000 mg | ORAL_TABLET | Freq: Three times a day (TID) | ORAL | 0 refills | Status: AC | PRN

## 2022-03-07 NOTE — ED Provider Notes (Signed)
[facility]    CSN: [id number] Arrival date & time: 03/07/22  1757      History   Chief Complaint Chief Complaint  Patient presents with   Letter for School/Work    Entered by patient    HPI Amy Belloso is a 31 y.o. male.   Patient here today for evaluation of nausea and diarrhea that started yesterday.  He states that he did have some headache but this is resolved.  He states throughout the day today nausea has improved.  He has not had any vomiting but has only been drinking fluids and has not really had appetite.  He did have abdominal pain but this is also resolved.  Family members have had similar symptoms.  He denies any fever.  Ultimately he needs a note to return to work.  The history is provided by the patient.    Past Medical History:  Diagnosis Date   Asthma    MRSA pneumonia ([place])     There are no problems to display for this patient.   Past Surgical History:  Procedure Laterality Date   LUNG SURGERY         Home Medications    Prior to Admission medications   Medication Sig Start Date End Date Taking? Authorizing Provider  ondansetron (ZOFRAN-ODT) 4 MG disintegrating tablet Take 1 tablet (4 mg total) by mouth every 8 (eight) hours as needed. 03/07/22  Yes Francene Finders, PA-C  albuterol (PROVENTIL) (2.5 MG/3ML) 0.083% nebulizer solution Take 2.5 mg by nebulization every 6 (six) hours as needed for wheezing or shortness of breath.    [provider]  albuterol (PROVENTIL) (2.5 MG/3ML) 0.083% nebulizer solution Take 6 mLs (5 mg total) by nebulization every 4 (four) hours as needed for wheezing or shortness of breath. 07/01/13   Piepenbrink, Anderson Malta, PA-C  albuterol (VENTOLIN HFA) 108 (90 Base) MCG/ACT inhaler Inhale 2 puffs into the lungs every 6 (six) hours as needed for wheezing or shortness of breath. 08/16/20   Volney American, PA-C  predniSONE (DELTASONE) 20 MG tablet Take 2 tablets (40 mg total) by mouth  daily. Patient not taking: Reported on 03/07/2022 07/01/13   Piepenbrink, Anderson Malta, PA-C  promethazine-dextromethorphan (PROMETHAZINE-DM) 6.25-15 MG/5ML syrup Take 5 mLs by mouth 4 (four) times daily as needed for cough. 08/16/20   Volney American, PA-C    Family History Family History  Problem Relation Age of Onset   Hypertension Mother    Hypertension Father     Social History Social History   Tobacco Use   Smoking status: Former   Smokeless tobacco: Never  Scientific laboratory technician Use: Never used  Substance Use Topics   Alcohol use: No   Drug use: No     Allergies   Patient has no known allergies.   Review of Systems Review of Systems  Constitutional:  Negative for chills and fever.  Eyes:  Negative for discharge and redness.  Respiratory:  Negative for shortness of breath.   Gastrointestinal:  Positive for abdominal pain, diarrhea and nausea. Negative for vomiting.  Neurological:  Positive for headaches (resolved). Negative for numbness.     Physical Exam Triage Vital Signs ED Triage Vitals [03/07/22 1838]  Enc Vitals Group     BP (!) 158/104     Pulse Rate (!) 104     Resp 18     Temp 98.2 F (36.8 C)     Temp Source Oral     SpO2  Weight      Height      Head Circumference      Peak Flow      Pain Score 0     Pain Loc      Pain Edu?      Excl. in GC?    No data found.  Updated Vital Signs BP (!) 158/104 (BP Location: Left Arm)   Pulse (!) 104   Temp 98.2 F (36.8 C) (Oral)   Resp 18      Physical Exam Vitals and nursing note reviewed.  Constitutional:      General: He is not in acute distress.    Appearance: Normal appearance. He is not ill-appearing.  HENT:     Head: Normocephalic and atraumatic.  Eyes:     Conjunctiva/sclera: Conjunctivae normal.  Cardiovascular:     Rate and Rhythm: Normal rate.  Pulmonary:     Effort: Pulmonary effort is normal.  Neurological:     Mental Status: He is alert.  Psychiatric:        Mood  and Affect: Mood normal.        Behavior: Behavior normal.        Thought Content: Thought content normal.      UC Treatments / Results  Labs (all labs ordered are listed, but only abnormal results are displayed) Labs Reviewed - No data to display  EKG   Radiology No results found.  Procedures Procedures (including critical care time)  Medications Ordered in UC Medications - No data to display  Initial Impression / Assessment and Plan / UC Course  I have reviewed the triage vital signs and the nursing notes.  Pertinent labs & imaging results that were available during my care of the patient were reviewed by me and considered in my medical decision making (see chart for details).   Zofran prescribed for nausea.  Work note provided as requested.  Recommended follow-up if symptoms do not continue to improve or with any further concerns.  Patient expresses understanding.  Final Clinical Impressions(s) / UC Diagnoses   Final diagnoses:  Gastroenteritis   Discharge Instructions   None    ED Prescriptions     Medication Sig Dispense Auth. Provider   ondansetron (ZOFRAN-ODT) 4 MG disintegrating tablet Take 1 tablet (4 mg total) by mouth every 8 (eight) hours as needed. 20 tablet Tomi Bamberger, PA-C      PDMP not reviewed this encounter.   Tomi Bamberger, PA-C 03/07/22 1850

## 2022-03-07 NOTE — ED Triage Notes (Signed)
Pt needs work note to return to work after having GI sx yesterday; pt sts resolved throughout today

## 2022-05-18 ENCOUNTER — Ambulatory Visit
Admission: RE | Admit: 2022-05-18 | Discharge: 2022-05-18 | Disposition: A | Discharge status: Home / Self Care | Payer: Self-pay | Source: Ambulatory Visit | Attending: Internal Medicine | Admitting: Internal Medicine

## 2022-05-18 VITALS — BP 145/83 | HR 86 | Temp 98.0°F | Resp 16

## 2022-05-18 DIAGNOSIS — U071 COVID-19: Secondary | ICD-10-CM | POA: Insufficient documentation

## 2022-05-18 DIAGNOSIS — Z20822 Contact with and (suspected) exposure to covid-19: Secondary | ICD-10-CM | POA: Insufficient documentation

## 2022-05-18 LAB — SARS CORONAVIRUS 2 (TAT 6-24 HRS): SARS Coronavirus 2: POSITIVE — AB

## 2022-05-18 MED ORDER — FLUTICASONE PROPIONATE 50 MCG/ACT NA SUSP: 1.0000 | Freq: Every day | NASAL | 0 refills | Status: AC

## 2022-05-18 MED ORDER — BENZONATATE 100 MG PO CAPS: 100.0000 mg | ORAL_CAPSULE | Freq: Three times a day (TID) | ORAL | 0 refills | Status: AC | PRN

## 2022-05-18 MED ORDER — PROMETHAZINE-DM 6.25-15 MG/5ML PO SYRP: 5.0000 mL | ORAL_SOLUTION | Freq: Every evening | ORAL | 0 refills | Status: AC | PRN

## 2022-05-18 NOTE — ED Triage Notes (Signed)
Pt presents to uc with co of covid exposure, his wife is positive. Symptoms started Wednesday. Pt reports cough congestion, fevers, ha, runny nose sneezing. Pt reports otc tylenol for symptoms.

## 2022-05-18 NOTE — Discharge Instructions (Signed)
You most likely have COVID-19 given your close exposure.  COVID test is pending.  Will call if it is positive.  I have prescribed you 3 medications to help alleviate symptoms.  Please be advised that Promethazine DM can cause drowsiness.  Follow-up if any symptoms persist or worsen. 

## 2022-05-18 NOTE — ED Provider Notes (Signed)
[name]    CSN: [id number] Arrival date & time: 05/18/22  1050      History   Chief Complaint Chief Complaint  Patient presents with   Covid Exposure    HPI Ricardo Mccarty is a 31 y.o. male.   Patient presents with nasal congestion, cough, fever, headache, runny nose that started about 2 to 3 days ago.  Patient reports that his wife recently tested positive for COVID-19.  He has not taken any home COVID tests.  Denies chest pain, shortness of breath, sore throat, ear pain, nausea, vomiting, diarrhea, abdominal pain.  Patient has taken Tylenol with minimal improvement in symptoms.  Patient reports history of asthma in childhood but no complications since.     Past Medical History:  Diagnosis Date   Asthma    MRSA pneumonia (HCC)     There are no problems to display for this patient.   Past Surgical History:  Procedure Laterality Date   LUNG SURGERY         Home Medications    Prior to Admission medications   Medication Sig Start Date End Date Taking? Authorizing Provider  benzonatate (TESSALON) 100 MG capsule Take 1 capsule (100 mg total) by mouth every 8 (eight) hours as needed for cough. 05/18/22  Yes Khira Cudmore, Rolly Salter E, FNP  fluticasone (FLONASE) 50 MCG/ACT nasal spray Place 1 spray into both nostrils daily. 05/18/22  Yes Antrice Pal, Acie Fredrickson, FNP  promethazine-dextromethorphan (PROMETHAZINE-DM) 6.25-15 MG/5ML syrup Take 5 mLs by mouth at bedtime as needed for cough. 05/18/22  Yes Zhaire Locker, Rolly Salter E, FNP  albuterol (PROVENTIL) (2.5 MG/3ML) 0.083% nebulizer solution Take 2.5 mg by nebulization every 6 (six) hours as needed for wheezing or shortness of breath.    [provider]  albuterol (PROVENTIL) (2.5 MG/3ML) 0.083% nebulizer solution Take 6 mLs (5 mg total) by nebulization every 4 (four) hours as needed for wheezing or shortness of breath. 07/01/13   Piepenbrink, Victorino Dike, PA-C  albuterol (VENTOLIN HFA) 108 (90 Base) MCG/ACT inhaler Inhale 2 puffs  into the lungs every 6 (six) hours as needed for wheezing or shortness of breath. 08/16/20   Particia Nearing, PA-C  ondansetron (ZOFRAN-ODT) 4 MG disintegrating tablet Take 1 tablet (4 mg total) by mouth every 8 (eight) hours as needed. 03/07/22   Tomi Bamberger, PA-C  predniSONE (DELTASONE) 20 MG tablet Take 2 tablets (40 mg total) by mouth daily. Patient not taking: Reported on 03/07/2022 07/01/13   [name], PA-C    Family History Family History  Problem Relation Age of Onset   Hypertension Mother    Hypertension Father     Social History Social History   Tobacco Use   Smoking status: Former   Smokeless tobacco: Never  Building services engineer Use: Every day   Substances: Nicotine, Flavoring  Substance Use Topics   Alcohol use: No   Drug use: No     Allergies   Patient has no known allergies.   Review of Systems Review of Systems Per HPI  Physical Exam Triage Vital Signs ED Triage Vitals  Enc Vitals Group     BP 05/18/22 1121 (!) 145/83     Pulse Rate 05/18/22 1121 86     Resp 05/18/22 1121 16     Temp 05/18/22 1121 98 F (36.7 C)     Temp src --      SpO2 05/18/22 1121 95 %     Weight --      Height --  Head Circumference --      Peak Flow --      Pain Score 05/18/22 1119 0     Pain Loc --      Pain Edu? --      Excl. in GC? --    No data found.  Updated Vital Signs BP (!) 145/83   Pulse 86   Temp 98 F (36.7 C)   Resp 16   SpO2 95%   Visual Acuity Right Eye Distance:   Left Eye Distance:   Bilateral Distance:    Right Eye Near:   Left Eye Near:    Bilateral Near:     Physical Exam Constitutional:      General: He is not in acute distress.    Appearance: Normal appearance. He is not toxic-appearing or diaphoretic.  HENT:     Head: Normocephalic and atraumatic.     Right Ear: Tympanic membrane and ear canal normal.     Left Ear: Tympanic membrane and ear canal normal.     Nose: Congestion present.      Mouth/Throat:     Mouth: Mucous membranes are moist.     Pharynx: No posterior oropharyngeal erythema.  Eyes:     Extraocular Movements: Extraocular movements intact.     Conjunctiva/sclera: Conjunctivae normal.     Pupils: Pupils are equal, round, and reactive to light.  Cardiovascular:     Rate and Rhythm: Normal rate and regular rhythm.     Pulses: Normal pulses.     Heart sounds: Normal heart sounds.  Pulmonary:     Effort: Pulmonary effort is normal. No respiratory distress.     Breath sounds: Normal breath sounds. No stridor. No wheezing, rhonchi or rales.  Abdominal:     General: Abdomen is flat. Bowel sounds are normal.     Palpations: Abdomen is soft.  Musculoskeletal:        General: Normal range of motion.     Cervical back: Normal range of motion.  Skin:    General: Skin is warm and dry.  Neurological:     General: No focal deficit present.     Mental Status: He is alert and oriented to person, place, and time. Mental status is at baseline.  Psychiatric:        Mood and Affect: Mood normal.        Behavior: Behavior normal.      UC Treatments / Results  Labs (all labs ordered are listed, but only abnormal results are displayed) Labs Reviewed  SARS CORONAVIRUS 2 (TAT 6-24 HRS)    EKG   Radiology No results found.  Procedures Procedures (including critical care time)  Medications Ordered in UC Medications - No data to display  Initial Impression / Assessment and Plan / UC Course  I have reviewed the triage vital signs and the nursing notes.  Pertinent labs & imaging results that were available during my care of the patient were reviewed by me and considered in my medical decision making (see chart for details).     Patient presents with symptoms likely from a viral upper respiratory infection. Differential includes bacterial pneumonia, sinusitis, allergic rhinitis, COVID-19, flu, RSV.  Most likely COVID-19 given patient's close exposure.  Do not  suspect underlying cardiopulmonary process. Symptoms seem unlikely related to ACS, CHF or COPD exacerbations, pneumonia, pneumothorax. Patient is nontoxic appearing and not in need of emergent medical intervention.  COVID test pending.  Discussed COVID antivirals with patient but patient wishes to decline  COVID antivirals at this time.  Recommended symptom control with medications and supportive care.  Patient sent prescriptions.  Advised patient Promethazine DM can cause drowsiness.   Return if symptoms fail to improve in 1-2 weeks or you develop shortness of breath, chest pain, severe headache. Patient states understanding and is agreeable.  Discharged with PCP followup.  Final Clinical Impressions(s) / UC Diagnoses   Final diagnoses:  COVID-19  Exposure to COVID-19 virus     Discharge Instructions      You most likely have COVID-19 given your close exposure.  COVID test is pending.  Will call if it is positive.  I have prescribed you 3 medications to help alleviate symptoms.  Please be advised that Promethazine DM can cause drowsiness.  Follow-up if any symptoms persist or worsen.    ED Prescriptions     Medication Sig Dispense Auth. Provider   fluticasone (FLONASE) 50 MCG/ACT nasal spray Place 1 spray into both nostrils daily. 16 g Selma Mink, Rolly Salter E, [place]   benzonatate (TESSALON) 100 MG capsule Take 1 capsule (100 mg total) by mouth every 8 (eight) hours as needed for cough. 21 capsule [place], [place] E, [place]   promethazine-dextromethorphan (PROMETHAZINE-DM) 6.25-15 MG/5ML syrup Take 5 mLs by mouth at bedtime as needed for cough. 118 mL Gustavus Bryant, [place]      PDMP not reviewed this encounter.   Gustavus Bryant, [place] 05/18/22 1219

## 2022-07-17 ENCOUNTER — Other Ambulatory Visit: Payer: Self-pay

## 2022-07-17 ENCOUNTER — Emergency Department (HOSPITAL_COMMUNITY)
Admission: EM | Admit: 2022-07-17 | Discharge: 2022-07-17 | Discharge status: Against Medical Advice | Payer: Self-pay | Attending: Student | Admitting: Student

## 2022-07-17 ENCOUNTER — Emergency Department (HOSPITAL_COMMUNITY)
Admission: EM | Admit: 2022-07-17 | Discharge: 2022-07-17 | Disposition: A | Discharge status: Home / Self Care | Payer: Self-pay | Attending: Emergency Medicine | Admitting: Emergency Medicine

## 2022-07-17 ENCOUNTER — Encounter (HOSPITAL_COMMUNITY): Payer: Self-pay

## 2022-07-17 ENCOUNTER — Emergency Department (HOSPITAL_COMMUNITY): Payer: Self-pay

## 2022-07-17 DIAGNOSIS — R1032 Left lower quadrant pain: Secondary | ICD-10-CM | POA: Insufficient documentation

## 2022-07-17 DIAGNOSIS — Z5321 Procedure and treatment not carried out due to patient leaving prior to being seen by health care provider: Secondary | ICD-10-CM | POA: Insufficient documentation

## 2022-07-17 DIAGNOSIS — R112 Nausea with vomiting, unspecified: Secondary | ICD-10-CM | POA: Insufficient documentation

## 2022-07-17 DIAGNOSIS — N50819 Testicular pain, unspecified: Secondary | ICD-10-CM | POA: Insufficient documentation

## 2022-07-17 DIAGNOSIS — N200 Calculus of kidney: Secondary | ICD-10-CM

## 2022-07-17 DIAGNOSIS — N132 Hydronephrosis with renal and ureteral calculous obstruction: Secondary | ICD-10-CM | POA: Insufficient documentation

## 2022-07-17 LAB — URINALYSIS, ROUTINE W REFLEX MICROSCOPIC
Bacteria, UA: NONE SEEN
Bilirubin Urine: NEGATIVE
Glucose, UA: NEGATIVE mg/dL
Ketones, ur: 20 mg/dL — AB
Leukocytes,Ua: NEGATIVE
Nitrite: NEGATIVE
Protein, ur: 100 mg/dL — AB
Specific Gravity, Urine: 1.017 (ref 1.005–1.030)
pH: 6 (ref 5.0–8.0)

## 2022-07-17 MED ORDER — ONDANSETRON 4 MG PO TBDP: 4.0000 mg | ORAL_TABLET | ORAL | 0 refills | Status: AC | PRN

## 2022-07-17 MED ORDER — TAMSULOSIN HCL 0.4 MG PO CAPS: 0.4000 mg | ORAL_CAPSULE | Freq: Every day | ORAL | 0 refills | Status: AC

## 2022-07-17 MED ORDER — TAMSULOSIN HCL 0.4 MG PO CAPS
0.4000 mg | ORAL_CAPSULE | Freq: Once | ORAL | Status: AC
  Administered 2022-07-17: 0.4 mg via ORAL
  Filled 2022-07-17: qty 1

## 2022-07-17 MED ORDER — HYDROCODONE-ACETAMINOPHEN 5-325 MG PO TABS: 1.0000 | ORAL_TABLET | Freq: Four times a day (QID) | ORAL | 0 refills | Status: AC | PRN

## 2022-07-17 MED ORDER — KETOROLAC TROMETHAMINE 30 MG/ML IJ SOLN
30.0000 mg | Freq: Once | INTRAMUSCULAR | Status: AC
  Administered 2022-07-17: 30 mg via INTRAMUSCULAR
  Filled 2022-07-17: qty 1

## 2022-07-17 MED ORDER — IBUPROFEN 600 MG PO TABS: 600.0000 mg | ORAL_TABLET | Freq: Three times a day (TID) | ORAL | 0 refills | Status: AC | PRN

## 2022-07-17 NOTE — ED Provider Triage Note (Signed)
Emergency Medicine Provider Triage Evaluation Note  Ricardo Mccarty , a 32 y.o. male  was evaluated in triage.  Pt complains of testicle pain and LLQ pain.  Is been intermittent since.  This morning, it radiates into his left lower quadrant.  No penile discharge, dysuria, hematuria, flank pain.  He is having nausea and vomiting..  Review of Systems  Per HPI  Physical Exam  There were no vitals taken for this visit. Gen:   Awake, no distress   Resp:  Normal effort  MSK:   Moves extremities without difficulty  Other:  TTP left lower quadrant  Medical Decision Making  Medically screening exam initiated at 5:20 PM.  Appropriate orders placed.  Ricardo Mccarty was informed that the remainder of the evaluation will be completed by another provider, this initial triage assessment does not replace that evaluation, and the importance of remaining in the ED until their evaluation is complete.  Ultrasound to evaluate for testicular torsion, also check abdominal labs, UA, GC   Ricardo Raring, PA-C 07/17/22 1721

## 2022-07-17 NOTE — ED Triage Notes (Signed)
Pt c/o testicular and LLQ pain that began at 1500 this afternoon; feels like "I got kicked in the balls really bad"; denies urinary issues

## 2022-07-17 NOTE — Discharge Instructions (Signed)
1.  Take ibuprofen as you are base pain control medication.  Take with food every 8 hours if needed.  If you need additional pain control, you may take 1-2 Vicodin tablets every 6 hours.  Treat nausea with Zofran tablets.  Take tamsulosin daily.  Try to stay well-hydrated. 2.  Make an appointment to follow-up with [facility]. 3.  Return to the emergency department if you have fevers, pain not controlled by medications or other concerning changes.

## 2022-07-17 NOTE — ED Notes (Addendum)
Upon learning he would have to wait for a room in the lobby; pt became agitated, states "I have to wait out there? I'm going to [place]"; RN encouraged pt to stay; pt continued to exit; PA [facility] aware

## 2022-07-17 NOTE — ED Triage Notes (Signed)
Pt bib ems c/o testicular and LLQ pain that began at 1500 this afternoon states testicles look discolored

## 2022-07-17 NOTE — ED Provider Notes (Signed)
[place] EMERGENCY DEPARTMENT AT [facility] Provider Note   CSN: [id number] Arrival date & time: 07/17/22  1821     History  Chief Complaint  Patient presents with   Abdominal Pain    Ricardo Mccarty is a 32 y.o. male.  HPI Patient reports he was at work when he got a sudden onset of pain in his very lower left abdomen and left testicle.  He reports it felt like he got kicked in the groin.  The pain brought him to his knees for a period and he also vomited.  Since that has eased off and he has some discomfort now but nowhere near as severe as previously.  Pain did not come suddenly with any particular lifting or position change.  He had otherwise been feeling well.  No fevers no chills no abdominal pain leading up to this.  He reports he had 1 episode with 17 of his severe back pain and was told that he had a kidney stone but he was never sure if he had actually had 1.    Home Medications Prior to Admission medications   Medication Sig Start Date End Date Taking? Authorizing Provider  HYDROcodone-acetaminophen (NORCO/VICODIN) 5-325 MG tablet Take 1-2 tablets by mouth every 6 (six) hours as needed for moderate pain. 07/17/22  Yes Charlesetta Shanks, MD  ibuprofen (ADVIL) 600 MG tablet Take 1 tablet (600 mg total) by mouth every 8 (eight) hours as needed. 07/17/22  Yes Charlesetta Shanks, MD  ondansetron (ZOFRAN-ODT) 4 MG disintegrating tablet Take 1 tablet (4 mg total) by mouth every 4 (four) hours as needed for nausea or vomiting. 07/17/22  Yes Charlesetta Shanks, MD  tamsulosin (FLOMAX) 0.4 MG CAPS capsule Take 1 capsule (0.4 mg total) by mouth daily. 07/17/22  Yes Charlesetta Shanks, MD  albuterol (PROVENTIL) (2.5 MG/3ML) 0.083% nebulizer solution Take 2.5 mg by nebulization every 6 (six) hours as needed for wheezing or shortness of breath.    [provider]  albuterol (PROVENTIL) (2.5 MG/3ML) 0.083% nebulizer solution Take 6 mLs (5 mg total) by nebulization every 4 (four)  hours as needed for wheezing or shortness of breath. 07/01/13   Piepenbrink, Anderson Malta, PA-C  albuterol (VENTOLIN HFA) 108 (90 Base) MCG/ACT inhaler Inhale 2 puffs into the lungs every 6 (six) hours as needed for wheezing or shortness of breath. 08/16/20   Volney American, PA-C  benzonatate (TESSALON) 100 MG capsule Take 1 capsule (100 mg total) by mouth every 8 (eight) hours as needed for cough. 05/18/22   Teodora Medici, FNP  fluticasone (FLONASE) 50 MCG/ACT nasal spray Place 1 spray into both nostrils daily. 05/18/22   Teodora Medici, FNP  ondansetron (ZOFRAN-ODT) 4 MG disintegrating tablet Take 1 tablet (4 mg total) by mouth every 8 (eight) hours as needed. 03/07/22   Francene Finders, PA-C  predniSONE (DELTASONE) 20 MG tablet Take 2 tablets (40 mg total) by mouth daily. Patient not taking: Reported on 03/07/2022 07/01/13   Piepenbrink, Anderson Malta, PA-C  promethazine-dextromethorphan (PROMETHAZINE-DM) 6.25-15 MG/5ML syrup Take 5 mLs by mouth at bedtime as needed for cough. 05/18/22   Teodora Medici, FNP      Allergies    Patient has no known allergies.    Review of Systems   Review of Systems  Physical Exam Updated Vital Signs BP 131/87   Pulse 63   Temp 98 F (36.7 C) (Oral)   Resp 18   Ht 5' 7"$  (1.702 m)   Wt 92.9 kg  SpO2 98%   BMI 32.08 kg/m  Physical Exam Constitutional:      Comments: Alert nontoxic well in appearance.  HENT:     Mouth/Throat:     Pharynx: Oropharynx is clear.  Eyes:     Extraocular Movements: Extraocular movements intact.  Cardiovascular:     Rate and Rhythm: Normal rate and regular rhythm.  Pulmonary:     Effort: Pulmonary effort is normal.     Breath sounds: Normal breath sounds.  Abdominal:     General: There is no distension.     Palpations: Abdomen is soft.     Tenderness: There is no abdominal tenderness. There is no guarding.  Genitourinary:    Comments: Scrotum is not significantly enlarged.  Mild discomfort left spermatic cord.   Testicle is smooth without any specific swelling or enlargement.  No hernia in the left inguinal canal. Musculoskeletal:        General: No swelling. Normal range of motion.     Right lower leg: No edema.     Left lower leg: No edema.  Skin:    General: Skin is warm and dry.  Neurological:     General: No focal deficit present.     Mental Status: He is oriented to person, place, and time.     Motor: No weakness.     Coordination: Coordination normal.     ED Results / Procedures / Treatments   Labs (all labs ordered are listed, but only abnormal results are displayed) Labs Reviewed  URINALYSIS, ROUTINE W REFLEX MICROSCOPIC - Abnormal; Notable for the following components:      Result Value   APPearance HAZY (*)    Hgb urine dipstick LARGE (*)    Ketones, ur 20 (*)    Protein, ur 100 (*)    All other components within normal limits    EKG None  Radiology CT Renal Stone Study  Result Date: 07/17/2022 CLINICAL DATA:  Left lower quadrant pain EXAM: CT ABDOMEN AND PELVIS WITHOUT CONTRAST TECHNIQUE: Multidetector CT imaging of the abdomen and pelvis was performed following the standard protocol without IV contrast. RADIATION DOSE REDUCTION: This exam was performed according to the departmental dose-optimization program which includes automated exposure control, adjustment of the mA and/or kV according to patient size and/or use of iterative reconstruction technique. COMPARISON:  C3 CT abdomen and pelvis 04/16/2009 FINDINGS: Lower chest: No acute abnormality. Hepatobiliary: No focal liver abnormality is seen. No gallstones, gallbladder wall thickening, or biliary dilatation. Pancreas: Unremarkable. No pancreatic ductal dilatation or surrounding inflammatory changes. Spleen: Normal in size without focal abnormality. Adrenals/Urinary Tract: 0 3 calculi in the proximal left ureter measuring up to 3 mm. There is minimal left-sided hydronephrosis and perinephric fat stranding. There are  additional punctate bilateral renal calculi. The adrenal glands and bladder are within normal limits. Stomach/Bowel: Stomach is within normal limits. Appendix appears normal. No evidence of bowel wall thickening, distention, or inflammatory changes. Vascular/Lymphatic: No significant vascular findings are present. No enlarged abdominal or pelvic lymph nodes. Reproductive: Prostate is unremarkable. Other: No abdominal wall hernia or abnormality. No abdominopelvic ascites. Musculoskeletal: No fracture is seen. IMPRESSION: 1. 3 calculi in the proximal left ureter measuring up to 3 mm with minimal left-sided hydronephrosis and perinephric fat stranding. 2. Additional punctate nonobstructing bilateral renal calculi. Electronically Signed   By: Ronney Asters M.D.   On: 07/17/2022 21:38   US SCROTUM W/DOPPLER  Result Date: 07/17/2022 CLINICAL DATA:  Bilateral testicular pain and swelling since 3 p.m. EXAM:  SCROTAL ULTRASOUND DOPPLER ULTRASOUND OF THE TESTICLES TECHNIQUE: Complete ultrasound examination of the testicles, epididymis, and other scrotal structures was performed. Color and spectral Doppler ultrasound were also utilized to evaluate blood flow to the testicles. COMPARISON:  CT 04/16/2009 FINDINGS: Right testicle Measurements: 3.9 x 2 x 3 cm.  No mass or microlithiasis visualized. Left testicle Measurements: 3.4 x 1.6 x 3 cm. No mass or microlithiasis visualized. Right epididymis:  Normal in size and appearance. Left epididymis: Small epididymal cyst or spermatocele measuring 3 mm maximal diameter. Hydrocele:  Small bilateral hydroceles are present. Varicocele:  None visualized. Pulsed Doppler interrogation of both testes demonstrates normal low resistance arterial and venous waveforms bilaterally. IMPRESSION: Normal ultrasound appearance of the testicles. No evidence of testicular mass, torsion, or inflammatory change. Small bilateral hydroceles are present. Electronically Signed   By: Lucienne Capers M.D.    On: 07/17/2022 19:24    Procedures Procedures    Medications Ordered in ED Medications  tamsulosin (FLOMAX) capsule 0.4 mg (0.4 mg Oral Given 07/17/22 2227)  ketorolac (TORADOL) 30 MG/ML injection 30 mg (30 mg Intramuscular Given 07/17/22 2227)    ED Course/ Medical Decision Making/ A&P                             Medical Decision Making Amount and/or Complexity of Data Reviewed Labs: ordered. Radiology: ordered.  Risk Prescription drug management.   Patient had acute onset of left very lower abdominal pain and testicular pain.  Differential diagnosis includes testicular torsion\kidney stone\epididymitis\inguinal hernia\colitis.  Will proceed first with urinalysis and testicular ultrasound to rule out torsion.  This time patient's pain has improved dramatically since onset and there is no objective testicular or scrotal swelling.  Will obtain ultrasound but I do not feel I need to emergently call urology at this time.  If ultrasound negative for torsion, anticipate CT stone study to rule out kidney stone.  Patient is currently comfortable and is not requiring pain control medications at this time.  Will continue to monitor closely.  Patient has not had significant recurrence in pain.  Testicular ultrasound normal.  CT scan interpreted by radiology positive for 3 mm stones at the proximal left ureter.  Patient is finding consistent with kidney stones.  He does not have fever or signs of UTI.  He is currently pain controlled.  Will start Flomax.  I have counseled the patient on the use of ibuprofen, Flomax, hydrocodone and Zofran at home.  We reviewed a follow-up plan with [facility].  Return precautions reviewed.  Patient discharged in good condition.        Final Clinical Impression(s) / ED Diagnoses Final diagnoses:  Kidney stone    Rx / DC Orders ED Discharge Orders          Ordered    HYDROcodone-acetaminophen (NORCO/VICODIN) 5-325 MG tablet  Every 6 hours PRN         07/17/22 2225    ibuprofen (ADVIL) 600 MG tablet  Every 8 hours PRN        07/17/22 2225    tamsulosin (FLOMAX) 0.4 MG CAPS capsule  Daily        07/17/22 2225    ondansetron (ZOFRAN-ODT) 4 MG disintegrating tablet  Every 4 hours PRN        07/17/22 2225              Charlesetta Shanks, MD 07/17/22 2228

## 2022-07-23 ENCOUNTER — Other Ambulatory Visit: Payer: Self-pay

## 2022-07-23 ENCOUNTER — Ambulatory Visit
Admission: RE | Admit: 2022-07-23 | Discharge: 2022-07-23 | Disposition: A | Discharge status: Home / Self Care | Payer: Self-pay | Source: Ambulatory Visit

## 2022-07-23 VITALS — BP 153/110 | HR 103 | Temp 98.7°F | Resp 18

## 2022-07-23 DIAGNOSIS — N200 Calculus of kidney: Secondary | ICD-10-CM

## 2022-07-23 NOTE — ED Triage Notes (Signed)
Pt here for recent diagnosis of kidney stone; pt had increased pain today and then noted passed stone; feeling relief at present

## 2022-07-23 NOTE — ED Provider Notes (Signed)
[name]    CSN: [id number] Arrival date & time: 07/23/22  1227      History   Chief Complaint Chief Complaint  Patient presents with   Abdominal Pain    Diagnosed with kidney stones last week , having extreme pain and vomiting , need a note for work - Entered by patient    HPI Ricardo Mccarty is a 32 y.o. male.   -year-old male presents with left flank pain.  Patient indicates he was diagnosed with a kidney stone on 07/17/2022.  He indicates that he had 3 stones that were present in the upper left part of the kidney.  He indicates he has been having pain on the left side, nausea and vomiting for the past several days.  He relates this morning that he passed one of the kidney stones.  He indicates he is improved and feels better since passing the stone, his nausea has resolved.  He indicates he is still feeling tender on the left side.  He is without fever or chills.  He continues to take the Flomax, and Zofran for nausea.  Patient relates drinking sodas and sun drop on a regular basis but has stopped.  He indicates he had a similar episode of left flank pain when he was 55 but was never officially diagnosed with kidney stone at that time. He relates that there is no family history of kidney stones.   Abdominal Pain   Past Medical History:  Diagnosis Date   Asthma    MRSA pneumonia ([place])     There are no problems to display for this patient.   Past Surgical History:  Procedure Laterality Date   LUNG SURGERY         Home Medications    Prior to Admission medications   Medication Sig Start Date End Date Taking? Authorizing Provider  albuterol (PROVENTIL) (2.5 MG/3ML) 0.083% nebulizer solution Take 2.5 mg by nebulization every 6 (six) hours as needed for wheezing or shortness of breath.    [provider]  albuterol (PROVENTIL) (2.5 MG/3ML) 0.083% nebulizer solution Take 6 mLs (5 mg total) by nebulization every 4 (four) hours as needed for wheezing  or shortness of breath. 07/01/13   Piepenbrink, Anderson Malta, PA-C  albuterol (VENTOLIN HFA) 108 (90 Base) MCG/ACT inhaler Inhale 2 puffs into the lungs every 6 (six) hours as needed for wheezing or shortness of breath. 08/16/20   Volney American, PA-C  benzonatate (TESSALON) 100 MG capsule Take 1 capsule (100 mg total) by mouth every 8 (eight) hours as needed for cough. 05/18/22   Teodora Medici, FNP  fluticasone (FLONASE) 50 MCG/ACT nasal spray Place 1 spray into both nostrils daily. 05/18/22   Teodora Medici, FNP  HYDROcodone-acetaminophen (NORCO/VICODIN) 5-325 MG tablet Take 1-2 tablets by mouth every 6 (six) hours as needed for moderate pain. 07/17/22   Charlesetta Shanks, MD  ibuprofen (ADVIL) 600 MG tablet Take 1 tablet (600 mg total) by mouth every 8 (eight) hours as needed. 07/17/22   Charlesetta Shanks, MD  ondansetron (ZOFRAN-ODT) 4 MG disintegrating tablet Take 1 tablet (4 mg total) by mouth every 8 (eight) hours as needed. 03/07/22   Francene Finders, PA-C  ondansetron (ZOFRAN-ODT) 4 MG disintegrating tablet Take 1 tablet (4 mg total) by mouth every 4 (four) hours as needed for nausea or vomiting. 07/17/22   Charlesetta Shanks, MD  predniSONE (DELTASONE) 20 MG tablet Take 2 tablets (40 mg total) by mouth daily. Patient not taking: Reported on  03/07/2022 07/01/13   Piepenbrink, Anderson Malta, PA-C  promethazine-dextromethorphan (PROMETHAZINE-DM) 6.25-15 MG/5ML syrup Take 5 mLs by mouth at bedtime as needed for cough. 05/18/22   Teodora Medici, FNP  tamsulosin (FLOMAX) 0.4 MG CAPS capsule Take 1 capsule (0.4 mg total) by mouth daily. 07/17/22   Charlesetta Shanks, MD    Family History Family History  Problem Relation Age of Onset   Hypertension Mother    Hypertension Father     Social History Social History   Tobacco Use   Smoking status: Former   Smokeless tobacco: Never  Scientific laboratory technician Use: Every day   Substances: Nicotine, Flavoring  Substance Use Topics   Alcohol use: No   Drug use: No      Allergies   Patient has no known allergies.   Review of Systems Review of Systems  Gastrointestinal:  Positive for abdominal pain (left flank).     Physical Exam Triage Vital Signs ED Triage Vitals  Enc Vitals Group     BP 07/23/22 1307 (!) 153/110     Pulse Rate 07/23/22 1307 (!) 103     Resp 07/23/22 1307 18     Temp 07/23/22 1307 98.7 F (37.1 C)     Temp Source 07/23/22 1307 Oral     SpO2 07/23/22 1307 96 %     Weight --      Height --      Head Circumference --      Peak Flow --      Pain Score 07/23/22 1309 0     Pain Loc --      Pain Edu? --      Excl. in [place]? --    No data found.  Updated Vital Signs BP (!) 153/110 (BP Location: Left Arm)   Pulse (!) 103   Temp 98.7 F (37.1 C) (Oral)   Resp 18   SpO2 96%   Visual Acuity Right Eye Distance:   Left Eye Distance:   Bilateral Distance:    Right Eye Near:   Left Eye Near:    Bilateral Near:   Left Kidney Stone:     Physical Exam Constitutional:      Appearance: He is well-developed.  Abdominal:     General: Abdomen is flat. Bowel sounds are normal.     Palpations: Abdomen is soft.     Tenderness: There is no abdominal tenderness. There is left CVA tenderness (mild). There is no guarding or rebound.  Neurological:     Mental Status: He is alert.      UC Treatments / Results  Labs (all labs ordered are listed, but only abnormal results are displayed) Labs Reviewed - No data to display  EKG   Radiology No results found.  Procedures Procedures (including critical care time)  Medications Ordered in UC Medications - No data to display  Initial Impression / Assessment and Plan / UC Course  I have reviewed the triage vital signs and the nursing notes.  Pertinent labs & imaging results that were available during my care of the patient were reviewed by me and considered in my medical decision making (see chart for details).    Plan: The diagnosis will be treated with the  following: 1.  Left nephrolithiasis: A.  Advised to continue taking the Flomax, Zofran and pain medicine as directed along with increasing fluid intake. 2.  Advised follow-up PCP or return to urgent care as needed. Final Clinical Impressions(s) / UC Diagnoses   Final  diagnoses:  Nephrolithiasis     Discharge Instructions      Take the Flomax as directed to help encourage renal stone passage. Advised to continue to increase fluid intake to help flush the urinary system.  Advised follow-up PCP or return to urgent care if symptoms fail to improve.    ED Prescriptions   None    I have reviewed the PDMP during this encounter.   Pepe, [place], PA-C 07/23/22 1323

## 2022-07-23 NOTE — Discharge Instructions (Signed)
Take the Flomax as directed to help encourage renal stone passage. Advised to continue to increase fluid intake to help flush the urinary system.  Advised follow-up PCP or return to urgent care if symptoms fail to improve.

## 2022-10-01 ENCOUNTER — Ambulatory Visit: Payer: Self-pay

## 2023-02-11 ENCOUNTER — Emergency Department (HOSPITAL_COMMUNITY)
Admission: EM | Admit: 2023-02-11 | Discharge: 2023-02-11 | Disposition: A | Discharge status: Home / Self Care | Payer: Self-pay

## 2023-02-11 ENCOUNTER — Other Ambulatory Visit: Payer: Self-pay

## 2023-02-11 ENCOUNTER — Ambulatory Visit
Admission: EM | Admit: 2023-02-11 | Discharge: 2023-02-11 | Disposition: A | Discharge status: Home / Self Care | Payer: Self-pay | Attending: Physician Assistant | Admitting: Physician Assistant

## 2023-02-11 ENCOUNTER — Ambulatory Visit: Payer: Self-pay

## 2023-02-11 ENCOUNTER — Encounter (HOSPITAL_COMMUNITY): Payer: Self-pay | Admitting: *Deleted

## 2023-02-11 ENCOUNTER — Emergency Department (HOSPITAL_COMMUNITY): Payer: Self-pay

## 2023-02-11 ENCOUNTER — Other Ambulatory Visit: Payer: Self-pay | Admitting: Urology

## 2023-02-11 DIAGNOSIS — N200 Calculus of kidney: Secondary | ICD-10-CM

## 2023-02-11 DIAGNOSIS — K920 Hematemesis: Secondary | ICD-10-CM

## 2023-02-11 DIAGNOSIS — R310 Gross hematuria: Secondary | ICD-10-CM

## 2023-02-11 HISTORY — DX: Calculus of kidney: N20.0

## 2023-02-11 LAB — URINALYSIS, ROUTINE W REFLEX MICROSCOPIC
Bacteria, UA: NONE SEEN
Bilirubin Urine: NEGATIVE
Glucose, UA: NEGATIVE mg/dL
Ketones, ur: NEGATIVE mg/dL
Leukocytes,Ua: NEGATIVE
Nitrite: NEGATIVE
Protein, ur: 100 mg/dL — AB
RBC / HPF: >50 RBC/hpf (ref 0–5)
Specific Gravity, Urine: 1.018 (ref 1.005–1.030)
pH: 9 — ABNORMAL HIGH (ref 5.0–8.0)

## 2023-02-11 LAB — POCT URINALYSIS DIP (MANUAL ENTRY)

## 2023-02-11 LAB — COMPREHENSIVE METABOLIC PANEL
ALT: 27 U/L (ref 0–44)
AST: 25 U/L (ref 15–41)
Albumin: 4.8 g/dL (ref 3.5–5.0)
Alkaline Phosphatase: 62 U/L (ref 38–126)
Anion gap: 8 (ref 5–15)
BUN: 13 mg/dL (ref 6–20)
CO2: 28 mmol/L (ref 22–32)
Calcium: 9.3 mg/dL (ref 8.9–10.3)
Chloride: 104 mmol/L (ref 98–111)
Creatinine, Ser: 1.02 mg/dL (ref 0.61–1.24)
GFR, Estimated: >60 mL/min (ref >60)
Glucose, Bld: 99 mg/dL (ref 70–99)
Potassium: 3.5 mmol/L (ref 3.5–5.1)
Sodium: 140 mmol/L (ref 135–145)
Total Bilirubin: 2.6 mg/dL — ABNORMAL HIGH (ref 0.3–1.2)
Total Protein: 8.2 g/dL — ABNORMAL HIGH (ref 6.5–8.1)

## 2023-02-11 LAB — CBC
HCT: 52.3 % — ABNORMAL HIGH (ref 39.0–52.0)
Hemoglobin: 18.1 g/dL — ABNORMAL HIGH (ref 13.0–17.0)
MCH: 31 pg (ref 26.0–34.0)
MCHC: 34.6 g/dL (ref 30.0–36.0)
MCV: 89.7 fL (ref 80.0–100.0)
Platelets: 260 10*3/uL (ref 150–400)
RBC: 5.83 MIL/uL — ABNORMAL HIGH (ref 4.22–5.81)
RDW: 12.6 % (ref 11.5–15.5)
WBC: 10.9 10*3/uL — ABNORMAL HIGH (ref 4.0–10.5)
nRBC: 0 % (ref 0.0–0.2)

## 2023-02-11 LAB — LIPASE, BLOOD: Lipase: 31 U/L (ref 11–51)

## 2023-02-11 MED ORDER — IBUPROFEN 600 MG PO TABS: 600.0000 mg | ORAL_TABLET | Freq: Four times a day (QID) | ORAL | 0 refills | Status: AC | PRN

## 2023-02-11 MED ORDER — ONDANSETRON HCL 4 MG PO TABS: 4.0000 mg | ORAL_TABLET | Freq: Four times a day (QID) | ORAL | 0 refills | Status: AC

## 2023-02-11 MED ORDER — TAMSULOSIN HCL 0.4 MG PO CAPS: 0.4000 mg | ORAL_CAPSULE | Freq: Every day | ORAL | 0 refills | Status: AC

## 2023-02-11 NOTE — ED Notes (Signed)
Patient Alert and oriented to baseline. Stable and ambulatory to baseline. Patient verbalized understanding of the discharge instructions.  Patient belongings were taken by the patient.   

## 2023-02-11 NOTE — ED Triage Notes (Signed)
Pt come from UC due to hematuria and bright red blood in emesis since last night. Has history of kidney stones.

## 2023-02-11 NOTE — ED Provider Notes (Signed)
Patient here today for evaluation of vomiting bright red blood, as well as hematuria that started last night.  Recommended further evaluation in the emergency room after gross hematuria noted with urinalysis and patient reports significant amount of bright red blood with vomiting.  Patient is agreeable and will report to ED from urgent care via POV.  He is stable at this time to transport himself.   Tomi Bamberger, PA-C 02/11/23 1300

## 2023-02-11 NOTE — Discharge Instructions (Addendum)
Please refer to the attached instructions. Urology will contact you with a follow-up appointment.

## 2023-02-11 NOTE — ED Provider Notes (Signed)
[place] EMERGENCY DEPARTMENT AT [facility] Provider Note   CSN: [id number] Arrival date & time: 02/11/23  1311     History  Chief Complaint  Patient presents with   Hematemesis   Hematuria    Ricardo Mccarty is a 32 y.o. male.  Patient presents from urgent care with complaint of hematuria and one episode of hematemesis, onset yesterday. No additional vomiting since last night. No current abdominal pain or nausea. Denies flank pain. History of kidney stones.  The history is provided by the patient and medical records. No language interpreter was used.  Hematuria This is a new problem. The current episode started 12 to 24 hours ago. The problem has not changed since onset.Associated symptoms include abdominal pain.       Home Medications Prior to Admission medications   Medication Sig Start Date End Date Taking? Authorizing Provider  albuterol (PROVENTIL) (2.5 MG/3ML) 0.083% nebulizer solution Take 2.5 mg by nebulization every 6 (six) hours as needed for wheezing or shortness of breath.    [provider]  albuterol (PROVENTIL) (2.5 MG/3ML) 0.083% nebulizer solution Take 6 mLs (5 mg total) by nebulization every 4 (four) hours as needed for wheezing or shortness of breath. 07/01/13   Piepenbrink, Victorino Dike, PA-C  albuterol (VENTOLIN HFA) 108 (90 Base) MCG/ACT inhaler Inhale 2 puffs into the lungs every 6 (six) hours as needed for wheezing or shortness of breath. 08/16/20   Particia Nearing, PA-C  benzonatate (TESSALON) 100 MG capsule Take 1 capsule (100 mg total) by mouth every 8 (eight) hours as needed for cough. 05/18/22   Gustavus Bryant, FNP  fluticasone (FLONASE) 50 MCG/ACT nasal spray Place 1 spray into both nostrils daily. 05/18/22   Gustavus Bryant, FNP  HYDROcodone-acetaminophen (NORCO/VICODIN) 5-325 MG tablet Take 1-2 tablets by mouth every 6 (six) hours as needed for moderate pain. 07/17/22   Arby Barrette, MD  ibuprofen (ADVIL) 600 MG tablet Take  1 tablet (600 mg total) by mouth every 8 (eight) hours as needed. 07/17/22   Arby Barrette, MD  ondansetron (ZOFRAN-ODT) 4 MG disintegrating tablet Take 1 tablet (4 mg total) by mouth every 8 (eight) hours as needed. 03/07/22   Tomi Bamberger, PA-C  ondansetron (ZOFRAN-ODT) 4 MG disintegrating tablet Take 1 tablet (4 mg total) by mouth every 4 (four) hours as needed for nausea or vomiting. 07/17/22   Arby Barrette, MD  predniSONE (DELTASONE) 20 MG tablet Take 2 tablets (40 mg total) by mouth daily. Patient not taking: Reported on 03/07/2022 07/01/13   Piepenbrink, Victorino Dike, PA-C  promethazine-dextromethorphan (PROMETHAZINE-DM) 6.25-15 MG/5ML syrup Take 5 mLs by mouth at bedtime as needed for cough. 05/18/22   Gustavus Bryant, FNP  tamsulosin (FLOMAX) 0.4 MG CAPS capsule Take 1 capsule (0.4 mg total) by mouth daily. 07/17/22   Arby Barrette, MD      Allergies    Patient has no known allergies.    Review of Systems   Review of Systems  Gastrointestinal:  Positive for abdominal pain and vomiting.  Genitourinary:  Positive for hematuria and testicular pain.  All other systems reviewed and are negative.   Physical Exam Updated Vital Signs BP (!) 170/119 (BP Location: Left Arm) Comment: rechecked x 2  Pulse (!) 101   Temp (!) 97.5 F (36.4 C) (Oral)   Ht 5\' 7"  (1.702 m)   Wt 90.7 kg   SpO2 97%   BMI 31.32 kg/m  Physical Exam Vitals and nursing note reviewed.  Constitutional:  Appearance: Normal appearance.  HENT:     Head: Normocephalic.     Nose: Nose normal.  Eyes:     Conjunctiva/sclera: Conjunctivae normal.  Cardiovascular:     Rate and Rhythm: Regular rhythm.  Pulmonary:     Effort: Pulmonary effort is normal.     Breath sounds: Normal breath sounds.  Abdominal:     General: There is no distension.     Palpations: Abdomen is soft.     Tenderness: There is no abdominal tenderness. There is no right CVA tenderness or left CVA tenderness.  Musculoskeletal:         General: Normal range of motion.  Skin:    General: Skin is warm and dry.  Neurological:     Mental Status: He is alert and oriented to person, place, and time.  Psychiatric:        Mood and Affect: Mood normal.        Behavior: Behavior normal.     ED Results / Procedures / Treatments   Labs (all labs ordered are listed, but only abnormal results are displayed) Labs Reviewed  COMPREHENSIVE METABOLIC PANEL - Abnormal; Notable for the following components:      Result Value   Total Protein 8.2 (*)    Total Bilirubin 2.6 (*)    All other components within normal limits  CBC - Abnormal; Notable for the following components:   WBC 10.9 (*)    RBC 5.83 (*)    Hemoglobin 18.1 (*)    HCT 52.3 (*)    All other components within normal limits  LIPASE, BLOOD  URINALYSIS, ROUTINE W REFLEX MICROSCOPIC    EKG None  Radiology CT ABDOMEN PELVIS WO CONTRAST  Result Date: 02/11/2023 CLINICAL DATA:  Vomiting, hematuria, flank pain. EXAM: CT ABDOMEN AND PELVIS WITHOUT CONTRAST TECHNIQUE: Multidetector CT imaging of the abdomen and pelvis was performed following the standard protocol without IV contrast. RADIATION DOSE REDUCTION: This exam was performed according to the departmental dose-optimization program which includes automated exposure control, adjustment of the mA and/or kV according to patient size and/or use of iterative reconstruction technique. COMPARISON:  07/17/2022. FINDINGS: Lower chest: Subsegmental atelectasis or scarring right middle lobe. No pleural or pericardial effusion. Hepatobiliary: No focal liver abnormality is seen. No gallstones, gallbladder wall thickening, or biliary dilatation. Pancreas: Unremarkable. No pancreatic ductal dilatation or surrounding inflammatory changes. Spleen: Normal in size without focal abnormality. Adrenals/Urinary Tract: No adrenal lesions. Intrarenal stone on the right measures 2 mm. No right-sided hydronephrosis. On the left there is perinephric  stranding and hydronephrosis with a mid left ureteral 6 mm obstructing stone. Unremarkable urinary bladder. Stomach/Bowel: Stomach is within normal limits. Appendix appears normal. No evidence of bowel wall thickening, distention, or inflammatory changes. Vascular/Lymphatic: No significant vascular findings are present. No enlarged abdominal or pelvic lymph nodes. Reproductive: Prostate is unremarkable. Other: No abdominal wall hernia or abnormality. No abdominopelvic ascites. Musculoskeletal: No acute or significant osseous findings. IMPRESSION: Right-sided nonobstructing stone. Left mid ureteral stone with obstruction. Electronically Signed   By: Layla Maw M.D.   On: 02/11/2023 15:11    Procedures Procedures    Medications Ordered in ED Medications - No data to display  ED Course/ Medical Decision Making/ A&P                                 Medical Decision Making Amount and/or Complexity of Data Reviewed Labs: ordered. Radiology: ordered.  Risk Prescription drug management.   Discussed with Dr Margo Aye (urology). Recommends flomax. Will follow-up in the office. Office will contact patient to schedule.  Patient has been pain free while in the ED. Will discharge home with prescriptions for flomax, NSAID, and zofran.  Pt has been diagnosed with a Kidney Stone via CT. There is no evidence of significant hydronephrosis, serum creatine WNL, vitals sign stable and the pt does not have irratractable vomiting. Pt will be dc home with pain medications & has been advised to follow up with urology.          Final Clinical Impression(s) / ED Diagnoses Final diagnoses:  Kidney stone on left side    Rx / DC Orders ED Discharge Orders          Ordered    tamsulosin (FLOMAX) 0.4 MG CAPS capsule  Daily        02/11/23 1606    ibuprofen (ADVIL) 600 MG tablet  Every 6 hours PRN        02/11/23 1606    ondansetron (ZOFRAN) 4 MG tablet  Every 6 hours        02/11/23 1606               Felicie Morn, NP 02/11/23 2238    Durwin Glaze, MD 02/12/23 [phone]

## 2023-02-11 NOTE — ED Triage Notes (Signed)
"  Last night came home from work, mowed grass, ate dinner, layed down in bed, started having abd pain, started vomiting, then started throwing up bright blood and abd pain". Pain remains today with blood in urine again today "I have had kidney stones in the past".

## 2023-02-11 NOTE — ED Notes (Signed)
Patient is being discharged from the Urgent Care and sent to the Emergency Department via Private Vehicle (self) . Per R. Reggie Pile, patient is in need of higher level of care due to Complexity of Care. Patient is aware and verbalizes understanding of plan of care.  Vitals:   02/11/23 1232 02/11/23 1234  BP: (!) 159/120 (!) 147/91  Pulse: 86 82  Resp: 18   Temp: 98.5 F (36.9 C)   SpO2: 99%

## 2023-02-12 ENCOUNTER — Telehealth: Payer: Self-pay | Admitting: Urology

## 2023-02-12 NOTE — Telephone Encounter (Signed)
Ricardo Maxcy, MD [facility] Admin Patient was in ED today with left ureteral stone. I would get him in asap- may need intervention. Needs KUB same day prior to visit. Kub order placed  LVM for pt to call and sch.

## 2023-03-10 ENCOUNTER — Other Ambulatory Visit: Payer: Self-pay

## 2023-03-10 ENCOUNTER — Emergency Department (HOSPITAL_COMMUNITY)
Admission: EM | Admit: 2023-03-10 | Discharge: 2023-03-10 | Discharge status: Against Medical Advice | Payer: Self-pay | Attending: Emergency Medicine | Admitting: Emergency Medicine

## 2023-03-10 ENCOUNTER — Encounter (HOSPITAL_COMMUNITY): Payer: Self-pay

## 2023-03-10 DIAGNOSIS — R110 Nausea: Secondary | ICD-10-CM | POA: Insufficient documentation

## 2023-03-10 DIAGNOSIS — Z5321 Procedure and treatment not carried out due to patient leaving prior to being seen by health care provider: Secondary | ICD-10-CM | POA: Insufficient documentation

## 2023-03-10 DIAGNOSIS — R109 Unspecified abdominal pain: Secondary | ICD-10-CM | POA: Insufficient documentation

## 2023-03-10 NOTE — ED Triage Notes (Signed)
Pt reports flank pain, started again 2 days ago. Referred to urologist 1 month ago after kidney stone. States he ran out of Flomax and would like more. Pain 2/10 at this time. Denies fever, chills, hematuria or any other symptoms at this time. Patient AAOX4, MAE, NAD.

## 2023-03-11 ENCOUNTER — Other Ambulatory Visit: Payer: Self-pay

## 2023-03-11 ENCOUNTER — Emergency Department (HOSPITAL_COMMUNITY)
Admission: EM | Admit: 2023-03-11 | Discharge: 2023-03-11 | Disposition: A | Discharge status: Home / Self Care | Payer: Self-pay | Attending: Emergency Medicine | Admitting: Emergency Medicine

## 2023-03-11 ENCOUNTER — Encounter (HOSPITAL_COMMUNITY): Payer: Self-pay

## 2023-03-11 ENCOUNTER — Emergency Department (HOSPITAL_COMMUNITY): Payer: Self-pay

## 2023-03-11 DIAGNOSIS — N23 Unspecified renal colic: Secondary | ICD-10-CM

## 2023-03-11 DIAGNOSIS — N201 Calculus of ureter: Secondary | ICD-10-CM

## 2023-03-11 DIAGNOSIS — N132 Hydronephrosis with renal and ureteral calculous obstruction: Secondary | ICD-10-CM | POA: Insufficient documentation

## 2023-03-11 DIAGNOSIS — Z7951 Long term (current) use of inhaled steroids: Secondary | ICD-10-CM | POA: Insufficient documentation

## 2023-03-11 DIAGNOSIS — J45909 Unspecified asthma, uncomplicated: Secondary | ICD-10-CM | POA: Insufficient documentation

## 2023-03-11 DIAGNOSIS — R109 Unspecified abdominal pain: Secondary | ICD-10-CM

## 2023-03-11 LAB — BASIC METABOLIC PANEL
Anion gap: 8 (ref 5–15)
BUN: 20 mg/dL (ref 6–20)
CO2: 23 mmol/L (ref 22–32)
Calcium: 9.1 mg/dL (ref 8.9–10.3)
Chloride: 105 mmol/L (ref 98–111)
Creatinine, Ser: 1.27 mg/dL — ABNORMAL HIGH (ref 0.61–1.24)
GFR, Estimated: >60 mL/min (ref >60)
Glucose, Bld: 103 mg/dL — ABNORMAL HIGH (ref 70–99)
Potassium: 3.5 mmol/L (ref 3.5–5.1)
Sodium: 136 mmol/L (ref 135–145)

## 2023-03-11 LAB — URINALYSIS, MICROSCOPIC (REFLEX): Squamous Epithelial / HPF: NONE SEEN /[HPF] (ref 0–5)

## 2023-03-11 LAB — CBC
HCT: 47.4 % (ref 39.0–52.0)
Hemoglobin: 16.8 g/dL (ref 13.0–17.0)
MCH: 31 pg (ref 26.0–34.0)
MCHC: 35.4 g/dL (ref 30.0–36.0)
MCV: 87.5 fL (ref 80.0–100.0)
Platelets: 227 10*3/uL (ref 150–400)
RBC: 5.42 MIL/uL (ref 4.22–5.81)
RDW: 12.2 % (ref 11.5–15.5)
WBC: 10.9 10*3/uL — ABNORMAL HIGH (ref 4.0–10.5)
nRBC: 0 % (ref 0.0–0.2)

## 2023-03-11 LAB — URINALYSIS, ROUTINE W REFLEX MICROSCOPIC
Bilirubin Urine: NEGATIVE
Glucose, UA: NEGATIVE mg/dL
Ketones, ur: NEGATIVE mg/dL
Leukocytes,Ua: NEGATIVE
Nitrite: NEGATIVE
Specific Gravity, Urine: 1.025 (ref 1.005–1.030)
pH: 6.5 (ref 5.0–8.0)

## 2023-03-11 MED ORDER — TAMSULOSIN HCL 0.4 MG PO CAPS: 0.4000 mg | ORAL_CAPSULE | Freq: Every day | ORAL | 0 refills | Status: AC

## 2023-03-11 MED ORDER — ONDANSETRON HCL 4 MG/2ML IJ SOLN: 4.0000 mg | Freq: Once | INTRAMUSCULAR | Status: DC

## 2023-03-11 MED ORDER — SODIUM CHLORIDE 0.9 % IV BOLUS
1000.0000 mL | Freq: Once | INTRAVENOUS | Status: AC
  Administered 2023-03-11: 1000 mL via INTRAVENOUS

## 2023-03-11 MED ORDER — KETOROLAC TROMETHAMINE 30 MG/ML IJ SOLN: 30.0000 mg | Freq: Once | INTRAMUSCULAR | Status: DC

## 2023-03-11 MED ORDER — HYDROCODONE-ACETAMINOPHEN 5-325 MG PO TABS: 1.0000 | ORAL_TABLET | Freq: Four times a day (QID) | ORAL | 0 refills | Status: AC | PRN

## 2023-03-11 MED ORDER — FENTANYL CITRATE PF 50 MCG/ML IJ SOSY: 50.0000 ug | PREFILLED_SYRINGE | Freq: Once | INTRAMUSCULAR | Status: DC

## 2023-03-11 MED ORDER — IBUPROFEN 200 MG PO TABS: 800.0000 mg | ORAL_TABLET | Freq: Four times a day (QID) | ORAL | 0 refills | Status: AC | PRN

## 2023-03-11 NOTE — Discharge Instructions (Addendum)
Dear Ricardo Mccarty  It was a pleasure taking care of you at [facility] ED. You were admitted for kidney stones  and treated for nausea, pain and repeated imaging which confirmed the stone is still present . We are discharging you home now. We reached out to Urology and they will be calling you to schedule an appointment for a possible surgical intervention 1) I have prescribed you a 30 day pain medication 2) I have also given you a 30 day Flomax 3) Please look out for a call from the urology clinic     Take care,  Dr. Kathleen Lime, MD

## 2023-03-11 NOTE — ED Triage Notes (Signed)
Patient has left sided flank pain for a few days. Was told he has a 6mm kidney stone. Came yesterday and got flomax but stated the pain is too much.

## 2023-03-11 NOTE — ED Provider Notes (Signed)
[place] EM[facility]GENC[facility] DEPARTMENT AT [facility] Provider Note   CSN: [id number] Arrival date & time: 03/11/23  5284     History  Chief Complaint  Patient presents with   Flank Pain    Ricardo Mccarty is a 32 y.o. male.   Flank Pain   Ricardo Mccarty is a 32 year old male presenting due to concerns of flank pain and nausea. Of note, he was seen September 10 for the same presentation.  Abdomen and pelvis revealed assessment of stone that was obstructing the left ureter. Urology saw the patient and recommended Flomax.  Patient report resolution of his hematuria but the flank pain and the nausea has persisted to today.   Home Medications Prior to Admission medications   Medication Sig Start Date End Date Taking? Authorizing Provider  albuterol (PROVENTIL) (2.5 MG/3ML) 0.083% nebulizer solution Take 2.5 mg by nebulization every 6 (six) hours as needed for wheezing or shortness of breath.    [provider]  albuterol (PROVENTIL) (2.5 MG/3ML) 0.083% nebulizer solution Take 6 mLs (5 mg total) by nebulization every 4 (four) hours as needed for wheezing or shortness of breath. 07/01/13   Piepenbrink, Victorino Dike, PA-C  albuterol (VENTOLIN HFA) 108 (90 Base) MCG/ACT inhaler Inhale 2 puffs into the lungs every 6 (six) hours as needed for wheezing or shortness of breath. 08/16/20   Particia Nearing, PA-C  benzonatate (TESSALON) 100 MG capsule Take 1 capsule (100 mg total) by mouth every 8 (eight) hours as needed for cough. 05/18/22   Gustavus Bryant, FNP  fluticasone (FLONASE) 50 MCG/ACT nasal spray Place 1 spray into both nostrils daily. 05/18/22   Gustavus Bryant, FNP  HYDROcodone-acetaminophen (NORCO/VICODIN) 5-325 MG tablet Take 1-2 tablets by mouth every 6 (six) hours as needed for moderate pain. 07/17/22   Arby Barrette, MD  ibuprofen (ADVIL) 600 MG tablet Take 1 tablet (600 mg total) by mouth every 8 (eight) hours as needed. 07/17/22   Arby Barrette, MD  ibuprofen (ADVIL)  600 MG tablet Take 1 tablet (600 mg total) by mouth every 6 (six) hours as needed. 02/11/23   Felicie Morn, NP  ondansetron (ZOFRAN) 4 MG tablet Take 1 tablet (4 mg total) by mouth every 6 (six) hours. As needed for nausea/vomiting 02/11/23   Felicie Morn, NP  ondansetron (ZOFRAN-ODT) 4 MG disintegrating tablet Take 1 tablet (4 mg total) by mouth every 8 (eight) hours as needed. 03/07/22   Tomi Bamberger, PA-C  ondansetron (ZOFRAN-ODT) 4 MG disintegrating tablet Take 1 tablet (4 mg total) by mouth every 4 (four) hours as needed for nausea or vomiting. 07/17/22   Arby Barrette, MD  predniSONE (DELTASONE) 20 MG tablet Take 2 tablets (40 mg total) by mouth daily. Patient not taking: Reported on 03/07/2022 07/01/13   Piepenbrink, Victorino Dike, PA-C  promethazine-dextromethorphan (PROMETHAZINE-DM) 6.25-15 MG/5ML syrup Take 5 mLs by mouth at bedtime as needed for cough. 05/18/22   Gustavus Bryant, FNP  tamsulosin (FLOMAX) 0.4 MG CAPS capsule Take 1 capsule (0.4 mg total) by mouth daily. 07/17/22   Arby Barrette, MD  tamsulosin (FLOMAX) 0.4 MG CAPS capsule Take 1 capsule (0.4 mg total) by mouth daily. 02/11/23   Felicie Morn, NP      Allergies    Patient has no known allergies.    Review of Systems   Review of Systems  Genitourinary:  Positive for flank pain.    Physical Exam Updated Vital Signs BP (!) 156/120   Pulse 94   Temp 98.6  F (37 C) (Oral)   Resp 18   Ht 5\' 7"  (1.702 m)   Wt 90 kg   SpO2 100%   BMI 31.08 kg/m  Physical Exam Constitutional:      General: He is not in acute distress.    Appearance: Normal appearance. He is not ill-appearing, toxic-appearing or diaphoretic.  HENT:     Head: Normocephalic.  Pulmonary:     Effort: Pulmonary effort is normal.  Abdominal:     General: Abdomen is flat.     Tenderness: There is no right CVA tenderness, left CVA tenderness, guarding or rebound.  Neurological:     Mental Status: He is alert.     ED Results / Procedures / Treatments    Labs (all labs ordered are listed, but only abnormal results are displayed) Labs Reviewed  URINALYSIS, ROUTINE W REFLEX MICROSCOPIC - Abnormal; Notable for the following components:      Result Value   Hgb urine dipstick LARGE (*)    Protein, ur TRACE (*)    All other components within normal limits  BASIC METABOLIC PANEL - Abnormal; Notable for the following components:   Glucose, Bld 103 (*)    Creatinine, Ser 1.27 (*)    All other components within normal limits  CBC - Abnormal; Notable for the following components:   WBC 10.9 (*)    All other components within normal limits  URINALYSIS, MICROSCOPIC (REFLEX) - Abnormal; Notable for the following components:   Bacteria, UA RARE (*)    All other components within normal limits    EKG None  Radiology No results found.  Procedures Procedures    Medications Ordered in ED Medications - No data to display  ED Course/ Medical Decision Making/ A&P                                 Medical Decision Making Amount and/or Complexity of Data Reviewed Labs: ordered.  This patient is a 32 y.o. male who presents to the ED for concern of flank pain and nausea, this involves an extensive number of treatment options, and is a complaint that carries with it a high risk of complications and morbidity. The emergent differential diagnosis prior to evaluation includes, but is not limited to, nephrolithiasis, pyelonephritis, Appendicitis . This is not an exhaustive differential.   Past Medical History / Co-morbidities / Social History: Kidney stones Asthma  Lab Tests: I ordered, and personally interpreted labs.  The pertinent results include:  CBC ,BMP, Urinalysis.   Imaging Studies: I ordered imaging studies including US Renal,KUB. I independently visualized and interpreted imaging which showed hydronephrosis of the left kidney concerning for existing stone.Calcification left hemipelvis could be propagation of the previously seen stone  more distally along the course of the ureter or bladder. I agree with the radiologist interpretation.   Medications: I ordered medication including Ondansetron, Fentanyl and Toradol  for nausea and pain. Reevaluation of the patient after these medicines showed that the patient improved. I have reviewed the patients home medicines and have made adjustments as needed.  Consultations Obtained: I requested consultation with the Urologist Dr Ray Church ,  and discussed lab and imaging findings as well as pertinent plan they recommended the patient go to the resident clinic to have a procedure done if needed    Disposition: After consideration of the diagnostic results and the patients response to treatment, I feel that the emergency department  workup does not suggest an emergent condition requiring admission or immediate intervention beyond what has been performed at this time. The plan is send patient home with pain meds and set up a urology appointment . The patient is safe for discharge and has been instructed to return immediately for worsening symptoms, change in symptoms or any other concerns.  I discussed this case with my attending physician Dr. Silverio Lay  who cosigned this note including patient's presenting symptoms, physical exam, and planned diagnostics and interventions. Attending physician stated agreement with plan or made changes to plan which were implemented.           Final Clinical Impression(s) / ED Diagnoses Final diagnoses:  None    Rx / DC Orders ED Discharge Orders     None         Kathleen Lime, MD 03/11/23 1659    Charlynne Pander, MD 03/17/23 1600
